# Patient Record
Sex: Female | Born: 1985 | Race: Black or African American | Hispanic: No | Smoking: Former smoker
Health system: Southern US, Community
[De-identification: ages and names within clinical notes are randomized; demographics above are authoritative.]

## PROBLEM LIST (undated history)

## (undated) ENCOUNTER — Inpatient Hospital Stay (HOSPITAL_COMMUNITY): Payer: Self-pay

## (undated) ENCOUNTER — Inpatient Hospital Stay (HOSPITAL_COMMUNITY): Payer: Medicaid Other

## (undated) DIAGNOSIS — T7491XA Unspecified adult maltreatment, confirmed, initial encounter: Secondary | ICD-10-CM

## (undated) DIAGNOSIS — D649 Anemia, unspecified: Secondary | ICD-10-CM

## (undated) DIAGNOSIS — B009 Herpesviral infection, unspecified: Secondary | ICD-10-CM

## (undated) DIAGNOSIS — N200 Calculus of kidney: Secondary | ICD-10-CM

## (undated) DIAGNOSIS — Z8619 Personal history of other infectious and parasitic diseases: Secondary | ICD-10-CM

## (undated) DIAGNOSIS — T7422XA Child sexual abuse, confirmed, initial encounter: Secondary | ICD-10-CM

## (undated) DIAGNOSIS — Z87440 Personal history of urinary (tract) infections: Secondary | ICD-10-CM

## (undated) HISTORY — DX: Herpesviral infection, unspecified: B00.9

## (undated) HISTORY — DX: Personal history of urinary (tract) infections: Z87.440

## (undated) HISTORY — DX: Child sexual abuse, confirmed, initial encounter: T74.22XA

## (undated) HISTORY — DX: Personal history of other infectious and parasitic diseases: Z86.19

## (undated) HISTORY — DX: Unspecified adult maltreatment, confirmed, initial encounter: T74.91XA

---
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
## 2005-01-05 ENCOUNTER — Emergency Department (HOSPITAL_COMMUNITY): Admission: EM | Admit: 2005-01-05 | Discharge: 2005-01-05 | Payer: Self-pay | Admitting: Emergency Medicine

## 2005-01-16 ENCOUNTER — Encounter: Admission: RE | Admit: 2005-01-16 | Discharge: 2005-01-16 | Payer: Self-pay | Admitting: Obstetrics and Gynecology

## 2005-02-13 ENCOUNTER — Emergency Department (HOSPITAL_COMMUNITY): Admission: EM | Admit: 2005-02-13 | Discharge: 2005-02-14 | Payer: Self-pay | Admitting: Emergency Medicine

## 2005-03-04 ENCOUNTER — Encounter: Admission: RE | Admit: 2005-03-04 | Discharge: 2005-03-04 | Payer: Self-pay | Admitting: Obstetrics and Gynecology

## 2005-05-05 ENCOUNTER — Emergency Department (HOSPITAL_COMMUNITY): Admission: EM | Admit: 2005-05-05 | Discharge: 2005-05-05 | Payer: Self-pay | Admitting: Emergency Medicine

## 2005-05-06 ENCOUNTER — Emergency Department (HOSPITAL_COMMUNITY): Admission: EM | Admit: 2005-05-06 | Discharge: 2005-05-06 | Payer: Self-pay | Admitting: Emergency Medicine

## 2005-05-08 ENCOUNTER — Emergency Department (HOSPITAL_COMMUNITY): Admission: EM | Admit: 2005-05-08 | Discharge: 2005-05-08 | Payer: Self-pay | Admitting: Emergency Medicine

## 2005-08-17 ENCOUNTER — Emergency Department (HOSPITAL_COMMUNITY): Admission: EM | Admit: 2005-08-17 | Discharge: 2005-08-18 | Payer: Self-pay | Admitting: Emergency Medicine

## 2005-08-29 ENCOUNTER — Ambulatory Visit (HOSPITAL_COMMUNITY): Admission: RE | Admit: 2005-08-29 | Discharge: 2005-08-29 | Payer: Self-pay | Admitting: Orthopaedic Surgery

## 2006-04-03 ENCOUNTER — Emergency Department (HOSPITAL_COMMUNITY): Admission: EM | Admit: 2006-04-03 | Discharge: 2006-04-03 | Payer: Self-pay | Admitting: Family Medicine

## 2006-05-02 ENCOUNTER — Emergency Department (HOSPITAL_COMMUNITY): Admission: EM | Admit: 2006-05-02 | Discharge: 2006-05-03 | Payer: Self-pay | Admitting: Emergency Medicine

## 2006-08-06 ENCOUNTER — Emergency Department (HOSPITAL_COMMUNITY): Admission: EM | Admit: 2006-08-06 | Discharge: 2006-08-06 | Payer: Self-pay | Admitting: Emergency Medicine

## 2007-06-21 ENCOUNTER — Emergency Department (HOSPITAL_COMMUNITY): Admission: EM | Admit: 2007-06-21 | Discharge: 2007-06-21 | Payer: Self-pay | Admitting: Emergency Medicine

## 2007-06-24 ENCOUNTER — Inpatient Hospital Stay (HOSPITAL_COMMUNITY): Admission: AD | Admit: 2007-06-24 | Discharge: 2007-06-24 | Payer: Self-pay | Admitting: Gynecology

## 2007-06-25 ENCOUNTER — Emergency Department (HOSPITAL_COMMUNITY): Admission: EM | Admit: 2007-06-25 | Discharge: 2007-06-25 | Payer: Self-pay | Admitting: Emergency Medicine

## 2007-08-06 ENCOUNTER — Emergency Department (HOSPITAL_COMMUNITY): Admission: EM | Admit: 2007-08-06 | Discharge: 2007-08-06 | Payer: Self-pay | Admitting: Emergency Medicine

## 2007-11-04 ENCOUNTER — Emergency Department (HOSPITAL_COMMUNITY): Admission: EM | Admit: 2007-11-04 | Discharge: 2007-11-04 | Payer: Self-pay | Admitting: Emergency Medicine

## 2008-01-27 ENCOUNTER — Inpatient Hospital Stay (HOSPITAL_COMMUNITY): Admission: AD | Admit: 2008-01-27 | Discharge: 2008-01-27 | Payer: Self-pay | Admitting: Obstetrics & Gynecology

## 2008-03-06 ENCOUNTER — Ambulatory Visit (HOSPITAL_COMMUNITY): Admission: RE | Admit: 2008-03-06 | Discharge: 2008-03-06 | Payer: Self-pay | Admitting: Obstetrics and Gynecology

## 2008-03-08 ENCOUNTER — Ambulatory Visit (HOSPITAL_COMMUNITY): Admission: RE | Admit: 2008-03-08 | Discharge: 2008-03-08 | Payer: Self-pay | Admitting: Obstetrics and Gynecology

## 2008-03-22 ENCOUNTER — Ambulatory Visit (HOSPITAL_COMMUNITY): Admission: RE | Admit: 2008-03-22 | Discharge: 2008-03-22 | Payer: Self-pay | Admitting: Obstetrics and Gynecology

## 2008-04-13 ENCOUNTER — Ambulatory Visit (HOSPITAL_COMMUNITY): Admission: RE | Admit: 2008-04-13 | Discharge: 2008-04-13 | Payer: Self-pay | Admitting: Obstetrics and Gynecology

## 2008-05-11 ENCOUNTER — Ambulatory Visit (HOSPITAL_COMMUNITY): Admission: RE | Admit: 2008-05-11 | Discharge: 2008-05-11 | Payer: Self-pay | Admitting: Obstetrics and Gynecology

## 2008-06-01 ENCOUNTER — Ambulatory Visit (HOSPITAL_COMMUNITY): Admission: RE | Admit: 2008-06-01 | Discharge: 2008-06-01 | Payer: Self-pay | Admitting: Obstetrics and Gynecology

## 2008-06-15 ENCOUNTER — Ambulatory Visit (HOSPITAL_COMMUNITY): Admission: RE | Admit: 2008-06-15 | Discharge: 2008-06-15 | Payer: Self-pay | Admitting: Obstetrics and Gynecology

## 2008-07-25 ENCOUNTER — Inpatient Hospital Stay (HOSPITAL_COMMUNITY): Admission: AD | Admit: 2008-07-25 | Discharge: 2008-07-25 | Payer: Self-pay | Admitting: Obstetrics and Gynecology

## 2008-07-28 ENCOUNTER — Inpatient Hospital Stay (HOSPITAL_COMMUNITY): Admission: AD | Admit: 2008-07-28 | Discharge: 2008-07-29 | Payer: Self-pay | Admitting: Obstetrics and Gynecology

## 2008-08-10 ENCOUNTER — Encounter (INDEPENDENT_AMBULATORY_CARE_PROVIDER_SITE_OTHER): Payer: Self-pay | Admitting: Obstetrics and Gynecology

## 2008-08-10 ENCOUNTER — Inpatient Hospital Stay (HOSPITAL_COMMUNITY): Admission: AD | Admit: 2008-08-10 | Discharge: 2008-08-13 | Payer: Self-pay | Admitting: Obstetrics and Gynecology

## 2008-09-08 ENCOUNTER — Emergency Department (HOSPITAL_COMMUNITY): Admission: EM | Admit: 2008-09-08 | Discharge: 2008-09-08 | Payer: Self-pay | Admitting: Emergency Medicine

## 2008-09-19 ENCOUNTER — Inpatient Hospital Stay (HOSPITAL_COMMUNITY): Admission: AD | Admit: 2008-09-19 | Discharge: 2008-09-19 | Payer: Self-pay | Admitting: Obstetrics and Gynecology

## 2009-05-26 ENCOUNTER — Emergency Department (HOSPITAL_COMMUNITY): Admission: EM | Admit: 2009-05-26 | Discharge: 2009-05-26 | Payer: Self-pay | Admitting: Emergency Medicine

## 2009-08-15 ENCOUNTER — Inpatient Hospital Stay (HOSPITAL_COMMUNITY): Admission: AD | Admit: 2009-08-15 | Discharge: 2009-08-15 | Payer: Self-pay | Admitting: Obstetrics and Gynecology

## 2009-12-17 ENCOUNTER — Inpatient Hospital Stay (HOSPITAL_COMMUNITY): Admission: AD | Admit: 2009-12-17 | Discharge: 2009-12-17 | Payer: Self-pay | Admitting: Obstetrics and Gynecology

## 2010-01-23 ENCOUNTER — Inpatient Hospital Stay (HOSPITAL_COMMUNITY): Admission: AD | Admit: 2010-01-23 | Discharge: 2010-01-23 | Payer: Self-pay | Admitting: Obstetrics and Gynecology

## 2010-03-17 ENCOUNTER — Inpatient Hospital Stay (HOSPITAL_COMMUNITY): Admission: AD | Admit: 2010-03-17 | Discharge: 2010-03-18 | Payer: Self-pay | Admitting: Obstetrics and Gynecology

## 2010-03-19 ENCOUNTER — Inpatient Hospital Stay (HOSPITAL_COMMUNITY): Admission: AD | Admit: 2010-03-19 | Discharge: 2010-03-22 | Payer: Self-pay | Admitting: Obstetrics and Gynecology

## 2010-03-19 ENCOUNTER — Encounter (INDEPENDENT_AMBULATORY_CARE_PROVIDER_SITE_OTHER): Payer: Self-pay | Admitting: Obstetrics and Gynecology

## 2010-08-13 ENCOUNTER — Emergency Department (HOSPITAL_COMMUNITY): Admission: EM | Admit: 2010-08-13 | Discharge: 2010-08-13 | Payer: Self-pay | Admitting: Emergency Medicine

## 2010-11-17 ENCOUNTER — Encounter: Payer: Self-pay | Admitting: Obstetrics and Gynecology

## 2010-11-18 ENCOUNTER — Encounter: Payer: Self-pay | Admitting: Obstetrics and Gynecology

## 2010-12-05 ENCOUNTER — Emergency Department (HOSPITAL_COMMUNITY)
Admission: EM | Admit: 2010-12-05 | Discharge: 2010-12-06 | Disposition: A | Discharge status: Home / Self Care | Payer: Medicaid Other | Attending: Emergency Medicine | Admitting: Emergency Medicine

## 2010-12-05 DIAGNOSIS — R109 Unspecified abdominal pain: Secondary | ICD-10-CM | POA: Insufficient documentation

## 2010-12-05 DIAGNOSIS — N898 Other specified noninflammatory disorders of vagina: Secondary | ICD-10-CM | POA: Insufficient documentation

## 2010-12-05 DIAGNOSIS — R112 Nausea with vomiting, unspecified: Secondary | ICD-10-CM | POA: Insufficient documentation

## 2010-12-06 LAB — URINALYSIS, ROUTINE W REFLEX MICROSCOPIC
Bilirubin Urine: NEGATIVE
Hgb urine dipstick: NEGATIVE
Specific Gravity, Urine: 1.025 (ref 1.005–1.030)
Urine Glucose, Fasting: NEGATIVE mg/dL
pH: 6 (ref 5.0–8.0)

## 2010-12-06 LAB — WET PREP, GENITAL: Clue Cells Wet Prep HPF POC: NONE SEEN

## 2010-12-06 LAB — CBC
HCT: 36 % (ref 36.0–46.0)
MCV: 84.7 fL (ref 78.0–100.0)
RDW: 13.1 % (ref 11.5–15.5)
WBC: 9 10*3/uL (ref 4.0–10.5)

## 2010-12-06 LAB — DIFFERENTIAL
Eosinophils Relative: 2 % (ref 0–5)
Lymphocytes Relative: 30 % (ref 12–46)
Lymphs Abs: 2.7 10*3/uL (ref 0.7–4.0)
Monocytes Absolute: 0.9 10*3/uL (ref 0.1–1.0)
Monocytes Relative: 10 % (ref 3–12)

## 2010-12-06 LAB — BASIC METABOLIC PANEL
CO2: 27 mEq/L (ref 19–32)
Chloride: 107 mEq/L (ref 96–112)
GFR calc non Af Amer: >60 mL/min (ref >60)
Glucose, Bld: 94 mg/dL (ref 70–99)
Potassium: 4.1 mEq/L (ref 3.5–5.1)
Sodium: 138 mEq/L (ref 135–145)

## 2010-12-06 LAB — GC/CHLAMYDIA PROBE AMP, GENITAL: Chlamydia, DNA Probe: NEGATIVE

## 2011-01-08 LAB — URINALYSIS, ROUTINE W REFLEX MICROSCOPIC
Ketones, ur: NEGATIVE mg/dL
Leukocytes, UA: NEGATIVE
Nitrite: POSITIVE — AB
Protein, ur: NEGATIVE mg/dL
pH: 6.5 (ref 5.0–8.0)

## 2011-01-08 LAB — WET PREP, GENITAL
Trich, Wet Prep: NONE SEEN
Yeast Wet Prep HPF POC: NONE SEEN

## 2011-01-08 LAB — URINE MICROSCOPIC-ADD ON

## 2011-01-08 LAB — POCT PREGNANCY, URINE: Preg Test, Ur: POSITIVE

## 2011-01-13 LAB — CBC
Platelets: 171 10*3/uL (ref 150–400)
RBC: 3.2 MIL/uL — ABNORMAL LOW (ref 3.87–5.11)
RDW: 13.9 % (ref 11.5–15.5)
WBC: 13.3 10*3/uL — ABNORMAL HIGH (ref 4.0–10.5)

## 2011-01-13 LAB — RPR: RPR Ser Ql: NONREACTIVE

## 2011-01-15 ENCOUNTER — Inpatient Hospital Stay (HOSPITAL_COMMUNITY): Payer: Medicaid Other

## 2011-01-15 ENCOUNTER — Inpatient Hospital Stay (HOSPITAL_COMMUNITY)
Admission: EM | Admit: 2011-01-15 | Discharge: 2011-01-15 | Disposition: A | Discharge status: Home / Self Care | Payer: Medicaid Other | Source: Ambulatory Visit | Attending: Obstetrics and Gynecology | Admitting: Obstetrics and Gynecology

## 2011-01-15 DIAGNOSIS — O210 Mild hyperemesis gravidarum: Secondary | ICD-10-CM | POA: Insufficient documentation

## 2011-01-15 LAB — GC/CHLAMYDIA PROBE AMP, GENITAL
Chlamydia, DNA Probe: NEGATIVE
GC Probe Amp, Genital: NEGATIVE

## 2011-01-15 LAB — URINALYSIS, ROUTINE W REFLEX MICROSCOPIC
Ketones, ur: NEGATIVE mg/dL
Nitrite: NEGATIVE
Specific Gravity, Urine: 1.025 (ref 1.005–1.030)
Urobilinogen, UA: 0.2 mg/dL (ref 0.0–1.0)
pH: 6 (ref 5.0–8.0)

## 2011-01-15 LAB — DIFFERENTIAL
Basophils Absolute: 0 10*3/uL (ref 0.0–0.1)
Basophils Relative: 0 % (ref 0–1)
Lymphocytes Relative: 19 % (ref 12–46)
Neutro Abs: 5.1 10*3/uL (ref 1.7–7.7)
Neutrophils Relative %: 62 % (ref 43–77)

## 2011-01-15 LAB — POCT PREGNANCY, URINE: Preg Test, Ur: POSITIVE

## 2011-01-15 LAB — RAPID STREP SCREEN (MED CTR MEBANE ONLY): Streptococcus, Group A Screen (Direct): NEGATIVE

## 2011-01-15 LAB — WET PREP, GENITAL
Clue Cells Wet Prep HPF POC: NONE SEEN
Trich, Wet Prep: NONE SEEN
Yeast Wet Prep HPF POC: NONE SEEN

## 2011-01-15 LAB — CBC
Hemoglobin: 11.4 g/dL — ABNORMAL LOW (ref 12.0–15.0)
MCHC: 32.7 g/dL (ref 30.0–36.0)
Platelets: 193 10*3/uL (ref 150–400)
RDW: 13 % (ref 11.5–15.5)

## 2011-01-19 LAB — STREP B DNA PROBE: Strep Group B Ag: NEGATIVE

## 2011-01-19 LAB — FETAL FIBRONECTIN: Fetal Fibronectin: NEGATIVE

## 2011-01-19 LAB — URINALYSIS, ROUTINE W REFLEX MICROSCOPIC
Bilirubin Urine: NEGATIVE
Hgb urine dipstick: NEGATIVE
Ketones, ur: NEGATIVE mg/dL
Nitrite: NEGATIVE
Specific Gravity, Urine: 1.015 (ref 1.005–1.030)
Urobilinogen, UA: 0.2 mg/dL (ref 0.0–1.0)

## 2011-01-19 LAB — WET PREP, GENITAL
Clue Cells Wet Prep HPF POC: NONE SEEN
Trich, Wet Prep: NONE SEEN

## 2011-01-19 LAB — URINE MICROSCOPIC-ADD ON

## 2011-01-19 LAB — GC/CHLAMYDIA PROBE AMP, GENITAL: Chlamydia, DNA Probe: NEGATIVE

## 2011-01-19 LAB — URINE CULTURE

## 2011-01-30 LAB — URINE MICROSCOPIC-ADD ON

## 2011-01-30 LAB — URINALYSIS, ROUTINE W REFLEX MICROSCOPIC
Bilirubin Urine: NEGATIVE
Nitrite: NEGATIVE
Specific Gravity, Urine: 1.025 (ref 1.005–1.030)
Urobilinogen, UA: 0.2 mg/dL (ref 0.0–1.0)
pH: 6 (ref 5.0–8.0)

## 2011-01-30 LAB — WET PREP, GENITAL

## 2011-01-30 LAB — GC/CHLAMYDIA PROBE AMP, GENITAL: Chlamydia, DNA Probe: NEGATIVE

## 2011-02-02 LAB — DIFFERENTIAL
Eosinophils Relative: 3 % (ref 0–5)
Lymphocytes Relative: 19 % (ref 12–46)
Lymphs Abs: 1.6 10*3/uL (ref 0.7–4.0)
Monocytes Relative: 10 % (ref 3–12)
Neutrophils Relative %: 68 % (ref 43–77)

## 2011-02-02 LAB — URINALYSIS, ROUTINE W REFLEX MICROSCOPIC
Glucose, UA: NEGATIVE mg/dL
Ketones, ur: NEGATIVE mg/dL
Leukocytes, UA: NEGATIVE
Nitrite: NEGATIVE
Specific Gravity, Urine: 1.03 (ref 1.005–1.030)
pH: 6 (ref 5.0–8.0)

## 2011-02-02 LAB — LIPASE, BLOOD: Lipase: 20 U/L (ref 11–59)

## 2011-02-02 LAB — URINE MICROSCOPIC-ADD ON

## 2011-02-02 LAB — CBC
MCHC: 31.2 g/dL (ref 30.0–36.0)
MCV: 89.6 fL (ref 78.0–100.0)
RBC: 4.51 MIL/uL (ref 3.87–5.11)
RDW: 13.1 % (ref 11.5–15.5)

## 2011-02-02 LAB — COMPREHENSIVE METABOLIC PANEL
AST: 20 U/L (ref 0–37)
CO2: 24 mEq/L (ref 19–32)
Calcium: 8.2 mg/dL — ABNORMAL LOW (ref 8.4–10.5)
Creatinine, Ser: 0.75 mg/dL (ref 0.4–1.2)
GFR calc Af Amer: >60 mL/min (ref >60)
GFR calc non Af Amer: >60 mL/min (ref >60)
Total Protein: 7 g/dL (ref 6.0–8.3)

## 2011-02-02 LAB — PREGNANCY, URINE: Preg Test, Ur: NEGATIVE

## 2011-03-11 NOTE — H&P (Signed)
[name], [name]                 ACCOUNT NO.:  [id number]   MEDICAL RECORD NO.:  [id number]          PATIENT TYPE:  MAT   LOCATION:  [facility]                          [facility]ILITY:  [facility]   PHYSICIAN:  Janine Limbo, M.D.DATE OF BIRTH:  1986-08-10   DATE OF ADMISSION:  08/10/2008  DATE OF DISCHARGE:                              HISTORY & PHYSICAL   HISTORY OF PRESENT ILLNESS:  [name] is a 25 year old female, gravida  3, para 1-1-0-3, who presents at [redacted] weeks gestation (EDC is September 06, 2008).  The patient has been followed at the [facility] of [facility].  The the patient's pregnancy  has been complicated by the fact that this infant has multiple fetal  anomalies.  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  The heart fills nearly the entire thorax.  The infant  is a female and he essentially has an absence of the abdominal wall both  anteriorly and laterally.  There is herniation of the liver, stomach,  and intestines outside of the abdomen.  There does not appear to be a  membrane that surrounds the abdominal viscera.  There is exaggerated  lordosis to the spine which diminishes the abdominal domain.  He is  missing one of his lower extremities.  There is a two-vessel umbilical  cord present.  The patient was seen in the office today and was noted to  be 3-4 cm dilated.  The infant was found to be in a breech presentation  with the abdominal contents presenting through membranes that were  protruding through the cervix.  The patient was sent to [facility]  for delivery.  The patient did have genetic studies performed and the  genetic studies were normal.   OBSTETRICAL HISTORY:  In July 2004 the patient had a cesarean section at  [redacted] weeks gestation.  She delivered two female infants.  The first infant  weighed 2 pounds 8 ounces.   The second infant weighed 2 pounds 7 ounces.  In December 2006 the patient had a vaginal delivery at [redacted] weeks  gestation where she delivered a 6 pound 8 ounce female infant.   DRUG ALLERGIES:  The patient says she is allergic to AMOXICILLIN,  PENICILLIN, SEPTRA, and CIPROFLOXACIN.  She also says that LATEX causes  itching.   PAST MEDICAL HISTORY:  The patient has a history of a cesarean section  as we previously mentioned.  The patient has had a broken toe on both of  her feet.  She denies hypertension and diabetes.   SOCIAL HISTORY:  The patient denies alcohol use, cigarette use, and  recreational drug use at the moment.  She did drink alcohol prior to her  pregnancy.   REVIEW OF SYSTEMS:  Normal pregnancy complaints.   FAMILY HISTORY:  Is noncontributory.   PHYSICAL EXAM:  VITAL SIGNS:  The patient is afebrile and her vital  signs are stable.  Her weight is  185 pounds.  HEENT: Is within normal limits.  CHEST:  Clear.  HEART:  Regular rate and rhythm.  BREASTS:  Within normal limits.  ABDOMEN:  Gravid with a fundal height of 36 cm.  EXTREMITIES:  Grossly normal  NEUROLOGY:  Grossly normal.  PELVIC:  Her cervix is 5 cm dilated, 75% effaced, and a hand seems to be  presenting through the cervix at this time.   LABORATORY VALUES:  Blood type is O+.  Beta strep is negative.  Gonorrhea negative and chlamydia negative in the third trimester.   Nonstress test is reactive and the patient is contracting mildly every 3-  6 minutes.   ASSESSMENT:  1. [redacted] weeks gestation.  2. Multiple fetal anomalies, probably not compatible with life.  3. Prior cesarean section.  4. Early labor.  5. Breech or transverse presentation.   PLAN:  A long discussion was held with the patient about the current  status of the baby and her delivery options.  We feel that the options  for management include an attempted vaginal delivery, cesarean delivery,  and the patient was actually given the option of  trying to stop  contractions and going home.  We do not think that going home is in  appropriate option.  The benefits of a vaginal delivery were reviewed  with the patient.  She understands that there may be further damage to  her infant with a vaginal delivery and she also understands that whether  or not there is further damage in delivery this infant will probably not  survive.  She can accept the possiblity that her infant may be damaged  more.  She actually wants her baby to be intubated if there is a chance  of survival.  She understands that a cesarean delivery is an operative  procedure and that the risks associated with a cesarean delivery  include, but are not limited to, anesthetic complications, bleeding,  infection, and possible damage to surrounding organs.  The patient  carefully considered all of her options and the risks and benefits  associated with those options.  After much deliberation the patient has  decided to proceed with cesarean delivery.      Janine Limbo, M.D.  Electronically Signed     AVS/MEDQ  D:  08/10/2008  T:  08/10/2008  Job:  [id number]

## 2011-03-11 NOTE — Discharge Summary (Signed)
[name], [name]                 ACCOUNT NO.:  [id number]   MEDICAL RECORD NO.:  [id number]          PATIENT TYPE:  INP   LOCATION:  9317                          [facility]ILITY:  [facility]   PHYSICIAN:  [name], M.D.DATE OF BIRTH:  December 13, 1985   DATE OF ADMISSION:  08/10/2008  DATE OF DISCHARGE:  08/13/2008                               DISCHARGE SUMMARY   ADMITTING DIAGNOSES:  1. Intrauterine pregnancy at 70 weeks' gestation.  2. Early labor.  3. Prior cesarean section  4. Fetus in breech or transverse presentation.  5. Multiple fetal anomalies with probable noncompatibility with life.   DISCHARGE DIAGNOSES:  1. Stable status post a repeat cesarean section for breech      presentation with findings of a nonviable female infant who was named      Apolinar Junes who was born at August 10, 2008, at 2338 p.m.  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  2. Grieving.  3. Desires oral contraceptive pills at present.  4. Insomnia.   HOSPITAL PROCEDURES:  1. Spinal anesthesia.  2. Repeat low transverse cesarean section.   HOSPITAL COURSE:  [name] is a 25 year old female gravida 3, para 1-1-0-  3 who presented at 39 weeks' gestation in early labor.  The patient had  been followed by MD service at [facility].  Patient's pregnancy had been  complicated by the fact that the infant had known multiple fetal  anomalies.  Ultrasounds had showed that infant may have what appeared to  be amniotic band-type syndrome and also showed small chest wall with  hypoplastic lung tissue, heart filling nearly the entire thorax.  Infant  is female.  There was absence of abdominal wall both anteriorly and  laterally, herniation of liver, stomach, and intestines outside of the  abdomen, did not appear to have membranes surrounding  abdominal viscera.  Exaggerated lordosis to the spine administering abdominal domain.  He  was missing one of lower extremities, 2-vessel umbilical cord.  Patient  was seen in the office on August 10, 2008, and was dilated 3 to 4 cm.  Infant was found to be in breech presentation with abdominal contents  presenting through membranes which were protruding through the cervix.  Patient was sent to [facility] for plans for delivery.  Patient did  have genetic studies performed including amniocentesis showing normal  karyotype.  On admission, patient's cervix was 5 cm dilated, 75%  effaced, and appeared hands to be presenting through the cervix through  membranes.  Discussion was held by Dr. Stefano Davis with the patient  regarding status of the baby and delivering options and cesarean  delivery was recommended secondary to the malpresentation.  Patient did  have Dr. Alison Davis, the attending NICU physician, talk with the patient  prior to delivery and plan was made to proceed with resuscitation versus  respite care depending on newborn's presentation at birth.  Patient was  prepped for OR and taken for a C-section which was performed under  spinal anesthesia and was performed by Dr. Stefano Davis as attending, [facility], assisting.  Findings as was mentioned were nonviable female  infant who they named Apolinar Junes who delivered August 10, 2008, at 2338  p.m.  Weight was 4 pounds 13 ounces (2187 g).  Apgar scores 1 at 1  minutes, 1 at 5 minutes, and 2 at 10 minutes and this was at 36 weeks'  gestation.  Length was approximately 14 inches.  Please see other NICU  notes regarding neonatal assessment.  Patient tolerated procedure well.  Placenta was sent to pathology.  Her estimated blood loss was 600 mL.  Patient was sent to recovery in good condition where she stayed until  she was taken to the floor by postoperative day #1.  Patient was  complaining of some itching and soreness; this was status post  neonatal  demise.  She was afebrile.  Vital signs were stable.  Abdomen benign.  Dressing was clean, dry, and intact.  Hemoglobin was down to 10.5.  Her  white count was stable at 13.4 and routine postoperative care was  continued including pastoral care and other protocol initiated for  neonatal demise.  On postoperative day #2, patient was feeling okay.  Pain was well controlled.  She was ambulating, voiding spontaneously,  tolerating p.o. liquids and solids without difficulty.  Emotional status  was within normal limits and appropriate.  She reported that day that  her mom is making funeral arrangements for the baby.  She had positive  flatulence.  Physical exam remained within normal limits.  She had small  lochia rubra.  Fundus was firm below umbilicus.  Incision clean, dry,  and intact without redness or signs or symptoms of infection.  Extremities with no edema and negative Homan's.  Routine postoperative  care and support was given.  On that day, postoperative day #2, Dr.  Pennie Davis discussed contraception with patient.  She verbally stated that  she was considering tubal but would like to further discuss at 6-week  postpartum visit.  She plans birth control pills at present until  decision was made if desires permanent sterilization.  Postop day #3,  patient was sleepy but ready for discharge.  She reports appetite very  little in the morning and picks up in the afternoon.  She denied any  nausea, vomiting, or diarrhea.  She is up without dizziness.  Pain well  managed with Motrin and p.r.n. Percocet.  Still continues to desire oral  contraceptive pills at present for contraception.  She did have some  difficulty sleeping and reported that she did not fall asleep until  approximately 5 a.m.  Per report, vaginal bleeding is light.  Voiding  spontaneous and positive flatulence.   PHYSICAL EXAM:  VITAL SIGNS:  Stable.  She is afebrile.  Patient was  sleepy on arrival, was having  trouble nodding while talking with her but  she was alert and oriented.  LUNGS:  Clear.  ABDOMEN:  Soft, appropriately tender.  Fundus was firm below umbilicus.  Incision is open to air, clean, dry, and intact.  No drainage or signs  or symptoms of infection.  She had small rubra lochia.  EXTREMITIES:  Within normal limits and she had negative Homan's.   Patient was deemed to have received full benefit of her hospital stay  and is being discharged home in  stable condition.  Discharge followup is  to occur at in 6 weeks at [facility] or p.r.n.   DISCHARGE MEDICATIONS:  1. Motrin 600 mg p.o. every 6 hours p.r.n. pain.  2. Percocet 5/325 one to 2 tabs p.o. every 4 to 6 hours p.r.n.      moderate to severe pain.  3. Ortho-Cept 1 tab p.o. daily, dispensed 1 pack with 5 refills.      Patient is to delay initiation until 2 weeks from today on a Sunday      start.  She was instructed on taking daily as prescribed.  4. Ambien 10 mg 1 tab p.o. q.h.s. p.r.n. insomnia.  5. Colace 1 tab p.o. q.a.m., may repeat dose in p.m. p.r.n.      constipation while on Percocet.  6. She is to continue a multivitamin 1 tab p.o. daily.   Support was given and instructed patient to call as needed with any  questions or concerns.      Candice Savage, [place]      [name], M.D.  Electronically Signed    CHS/MEDQ  D:  08/13/2008  T:  08/13/2008  Job:  [id number]

## 2011-03-11 NOTE — Op Note (Signed)
[name], [name]                 ACCOUNT NO.:  [id number]   MEDICAL RECORD NO.:  [id number]          PATIENT TYPE:  INP   LOCATION:  9317                          [facility]   PHYSICIAN:  Janine Limbo, M.D.DATE OF BIRTH:  Jun 23, 1986   DATE OF PROCEDURE:  DATE OF DISCHARGE:                               OPERATIVE REPORT   PREOPERATIVE DIAGNOSES:  1. Thirty six weeks gestation.  2. Anomalous infant.  3. Early labor.  4. Breech presentation.  5. Prior cesarean section.   POSTOPERATIVE DIAGNOSES:  1. Thirty six weeks gestation.  2. Anomalous infant.  3. Early labor.  4. Breech presentation.  5. Prior cesarean section.   PROCEDURE:  Repeat low transverse cesarean section.   SURGEON:  Janine Limbo, MD   FIRST ASSISTANT:  [name], CNM   ANESTHETIC:  Spinal.   DISPOSITION:  [name] is a 25 year old female, gravida 3, para 1-1-0-3,  who presents at [redacted] weeks gestation.  The patient has been followed at  the [facility].  This pregnancy has been complicated by the fact  that the infant has multiple anomalies.  These anomalies are thought to  be consistent with perhaps an amniotic band syndrome.  The infant had an  ultrasound which shows hyperplastic lungs, and a very small chest.  The  heart completely fills the thoracic cavity.  He has an absent abdominal  wall, both anteriorly and laterally.  There is herniation of the liver,  stomach, and intestines outside of the abdomen.  There is a lordosis to  the spine.  One of the lower extremities is missing.  There is a two-  vessel umbilical cord.  The patient was seen in the office today and was  noted to be 3-4 cm dilated.  Upon evaluation at [facility], the  cervix was 5 cm dilated.  An ultrasound in the office showed a breech  presentation and evaluation at [facility] show that the presenting  part was a hand.   The patient had a cesarean section in 2004, where she  delivered twins at 57 weeks' gestation.  She subsequently had a vaginal  delivery after cesarean section in 2006.  We discussed our management  options.  We discussed the benefits and risk associated with a vaginal  delivery and we also discussed the benefits and risks associated with a  cesarean delivery.  The patient could not accept the fact that more  damage could be done to her son if a vaginal delivery was attempted.  She also requested that her son be intubated after delivery.  After  carefully considering all of her options, she elected to proceed with a  repeat cesarean delivery.  The risk associated with cesarean delivery  were reviewed including, but not limited to, anesthetic, complications,  bleeding, infections, and possible damage to the surrounding organs.   FINDINGS:  The weight of the delivery infant is currently not known.  The Apgars scores were 1 at 1 minute, 2 at 5 minutes, and 2 at  10  minutes.  Multiple anomalies were appreciated as mentioned above.  The  abdominal contents were indeed outside of the abdominal cavity.  The  chest was hypoplastic.  The infant did have a heart beat and for that  reason the neonatologist in attendance elected to intubate the infant  and attempt oxygenation.  It became apparent that the infant was not  able to oxygenate its blood.  The little boy was named Therapist, sports.  The  placenta was sent to pathology.  The uterus, fallopian tubes, and the  ovaries were normal for the gravid state.   PROCEDURE:  The patient was taken to the operating room where a spinal  anesthetic was given.  The patient's abdomen, perineum, and outer vagina  were prepped with multiple layers of Betadine.  A Foley catheter was  placed in the bladder.  The patient was sterilely draped.  The patient  had received clindamycin 900 mg intravenously prior to coming to the  operating room.  Compression stockings were  placed on the patient's  legs.  A low transverse incision was made in the abdomen after 10 mL of  0.5% Marcaine with epinephrine were instilled.  The incision was  extended through the subcutaneous tissue, the fascia, and the anterior  peritoneum.  An incision was made in the lower uterine segment and  extended in a low transverse fashion.  The membranes were ruptured.  The  anomalous bowel was encountered.  The one leg of the infant was  delivered and then the bow was delivered through the incision.  We then  delivered the chest, shoulders, and head.  A short cord was present.  The cord was clamped and cut and the infant was handed to the awaiting  pediatric team.  Routine cord blood studies were obtained.  The placenta  was removed.  The uterine cavity was cleaned of amniotic fluid, clotted  blood, and membranes.  The uterine incision was closed using a running  locking suture of 2-0 Vicryl.  An imbricating suture of 2-0 Vicryl was  then placed.  Hemostasis was adequate.  The pelvis was vigorously  irrigated.  The anterior peritoneum and abdominal musculature were  reapproximated in the midline using 2-0 Vicryl.  The fascia was closed  using a running suture of 0 Vicryl followed by 3 interrupted sutures of  0 Vicryl.  The skin was reapproximated using a subcuticular suture of 3-  0 Monocryl.  Sponge, needle, and instrument counts were correct on 2  occasions.  The estimated blood loss for the procedure was 600 mL.  The  patient tolerated her procedure well.  The patient did have nausea  throughout her procedure and she was taken to the recovery room in  stable condition.  The placenta was sent to pathology.  The infant was  taken to the neonatal intensive care nursery.      Janine Limbo, M.D.  Electronically Signed     AVS/MEDQ  D:  08/11/2008  T:  08/11/2008  Job:  [id number]

## 2011-03-14 NOTE — H&P (Signed)
[name], [name]                 ACCOUNT NO.:  [id number]   MEDICAL RECORD NO.:  [id number]          PATIENT TYPE:  MAT   LOCATION:  [facility]C                          [facility]:  [facility]   PHYSICIAN:  Janine Limbo, M.D.DATE OF BIRTH:  1986/09/15   DATE OF ADMISSION:  11/09/2004  DATE OF DISCHARGE:                                HISTORY & PHYSICAL   HISTORY OF PRESENT ILLNESS:  [name] is an 25 year old single black female,  gravida 2, para 1-0-0-2, at 37-2/7 weeks, who presents complaining of  leaking a gush of fluid around 8:30 p.m. on November 08, 2004.  She denies  any subsequent leaking.  She denies any painful uterine contractions.  She  reports positive fetal movement.  She denies any nausea, vomiting, headaches  or visual disturbances.  Her pregnancy has been followed at [facility] by the M.D. service and has been at risk for:  #1 - A history of 36-  week preterm delivery of twins by cesarean section for questionable  abruption, #2 - new paternity with this pregnancy, #3 - adolescent, #4 -  questionable LMP.   OBSTETRICAL/GYNECOLOGICAL HISTORY:  She is a gravida 2, para 0-1-0-2, who  had a primary low transverse cesarean section for delivery of twins in July  of '04 with possible abruption being the cause and I think one of her babies  was breech as well.  This current pregnancy is of a different paternity and  EDC by ultrasound is November 28, 2004.  Her other GYN history is  noncontributory, other than having Chlamydia in May of 2004.   MEDICAL HISTORY:  She reports having had the usual childhood diseases.  She  has no other medical issues and her only surgery was the cesarean section  for her twins.   ALLERGIES:  She reports having allergy to AMOXICILLIN and SEPTRA; she is not  sure of the reaction that she has had with either of those.   FAMILY HISTORY:  Her family history is significant for maternal grandmother  with chronic hypertension and varicosities  and adult-onset diabetes, and  also maternal grandmother has lupus and paternal grandmother has possible  leukemia.   GENETIC HISTORY:  Her genetic history is negative.   SOCIAL HISTORY:  The father of the baby is Chanetta Marshall; he is involve and  supportive.  He is a full-time Archivist; she is also a Consulting civil engineer.  She  denies any illicit drug use, alcohol or smoking with this pregnancy.   PRENATAL LABORATORIES:  Her blood type is O-positive, her antibody screen is  negative.  Sickle cell trait is negative.  Syphilis is nonreactive.  Rubella  is immune.  Hepatitis B surface antigen is negative.  HIV is nonreactive.  Cystic fibrosis is negative.  Chlamydia was positive in July of '05 and Pap  was within normal limits.  Gonorrhea was negative; multiple testings  throughout her pregnancy have been negative for GC, Chlamydia and beta  strep.  Her 1-hour Glucola was 91.   PHYSICAL EXAMINATION:  VITAL SIGNS:  Her vital signs are stable.  She is  afebrile.  HEENT:  Grossly within normal limits.  HEART:  Regular rate and rhythm.  CHEST:  Her chest is clear.  BREASTS:  Breasts are soft and nontender.  ABDOMEN:  Her abdomen is gravid with irregular mild uterine contractions.  Her fetal heart rate is overall reactive and reassuring, but has had  episodes of decreased long-term variability and 1 episode on the tracing  that showed a fetal heart rate deceleration down to the 60s for  approximately 2-3 minutes and the ultrasound tech reported noting a  deceleration down to the 90s while doing her ultrasound.  Current heart rate  is in the 140s to 150s.  PELVIC:  Sterile speculum exam was performed and that shows no pooling,  negative Nitrazine, negative fern.  Cervix is fingertip, thick, vertex and  high.  EXTREMITIES:  Her extremities are within normal limits.   ACCESSORY CLINICAL DATA:  Her ultrasound had a biophysical profile score of  6 out of 8 with 0 for breathing movements, cephalic  presentation with an AFI  of 14.9 and a cervix of 3-cm long.   ASSESSMENT:  1.  Intrauterine pregnancy at 37-2/7 weeks.  2.  Previous cesarean section, planning vaginal birth attempt.  3.  Questionable fetal heart rate decelerations.  4.  Unfavorable cervix.   PLAN:  Her plan per consult with Dr. Janine Limbo is to admit for  further monitoring.     Shel   SJD/MEDQ  D:  11/09/2004  T:  11/09/2004  Job:  [id number]

## 2011-03-14 NOTE — H&P (Signed)
NAME:  [name], [name]                           ACCOUNT NO.:  [id number]   MEDICAL RECORD NO.:  [id number]                   PATIENT TYPE:  INP   LOCATION:  9165                                 [facility]   PHYSICIAN:  Naima A. Dillard, M.D.              DATE OF BIRTH:  03-31-1986   DATE OF ADMISSION:  05/01/2003  DATE OF DISCHARGE:                                HISTORY & PHYSICAL   HISTORY OF PRESENT ILLNESS:  [name] is a 25 year old, single, African-  American female, G1, P0 at 27-3/7 weeks by ultrasound which is her best date  in criteria, who presents complaining of uterine contractions and/or  cramping since about 2 a.m. this morning.  She denies any leaking or vaginal  bleeding, but has developed some bloody show since being in maternity  admissions.  She denies any nausea, vomiting, headaches or visual  disturbances.  Her pregnancy has been followed at [facility] by  the M.D. service and has been complicated by twin gestation, being an  adolescent and positive Chlamydia treated on Mar 20, 2003, with  azithromycin.   ALLERGIES:  AMOXICILLIN.  SEPTRA.   PAST OBSTETRICAL HISTORY:  Primigravida with LMP of October 10, 2002, that  she reports is certain and was normal.  However, early ultrasound dating  gave her an [facility] of July 28, 2003, which has been decided to be her best  due date.   PAST GYNECOLOGICAL HISTORY:  No other GYN issues.   PAST MEDICAL HISTORY:  1. She reports having had the usual childhood diseases.  2. Occasional urinary tract infection.   FAMILY HISTORY:  Significant for maternal grandfather with hypertension.  Maternal grandfather with adult-onset diabetes.  Maternal grandmother with  possibly lupus.   GENETIC HISTORY:  Negative.   SOCIAL HISTORY:  She is single.  The father of the baby is [name] and  he is not involved.  She is a Consulting civil engineer at [employer].  She lives with  her mom and has good support from her family.   She is of the [place]  faith.  She denies any illicit drug use, alcohol or smoking with this  pregnancy.   PRENATAL LABORATORY DATA:  Not currently available.   PHYSICAL EXAMINATION:  VITAL SIGNS:  Stable.  Afebrile.  HEENT:  Grossly within normal limits.  HEART:  Regular rate and rhythm.  CHEST:  Clear.  BREASTS:  Soft and nontender.  ABDOMEN:  Gravid with uterine contractions initially noted every three to  four minutes that have decreased with subcu terbutaline.  PELVIC:  A  speculum exam was performed and noted a moderate amount of bloody show,  visible bulging membranes.  On digital exam, she is completely dilated with  bulging membranes and presenting part is difficult to assess without  penetrating the membranes.  EXTREMITIES:  Within normal limits.   ASSESSMENT:  Intrauterine pregnancy at 27-3/7  weeks by ultrasound criteria  with twin gestation in preterm labor.   PLAN:  1. Per consult with Dr. Normand Sloop, to give her betamethasone, clindamycin IV,     magnesium sulfate IV for tocolysis.  2. Stat ultrasound for position and growth.     [name], C.N.M.              Naima A. Normand Sloop, M.D.    SJD/MEDQ  D:  05/01/2003  T:  05/01/2003  Job:  [id number]

## 2011-03-14 NOTE — H&P (Signed)
[name], Davis                 ACCOUNT NO.:  [id number]   MEDICAL RECORD NO.:  [id number]          PATIENT TYPE:  INP   LOCATION:  9164                          [facility]   PHYSICIAN:  Osborn Coho, M.D.   DATE OF BIRTH:  Feb 15, 1986   DATE OF ADMISSION:  11/18/2004  DATE OF DISCHARGE:                                HISTORY & PHYSICAL   REASON FOR ADMISSION:  Mrs. [name] is an 25 year old gravida 2, para 2.  She  had twins by cesarean section in July 2004.  She presents now at 38-4/7  weeks, EDD November 28, 2004 by 7-week, 6-day ultrasound.  Her contractions  are every three to four minutes.  She reports positive fetal movement.  No  bleeding, no rupture of membranes.  She is noted to be 4 cm dilated and is  to be admitted in labor.  She denies any pregnancy induced hypertension  symptoms.  No headache, visual changes or epigastric ain.  Her pregnancy has  been followed by the M.D. service at [facility] and is remarkable for:  1.  Adolescent.  2.  Preterm delivery at 27 weeks of twins by cesarean section, ?      abruption/breech presentation.  3.  Desires VBAC.  4.  Group B strep negative.   This patient began prenatal care at office of [facility] on May 21, 2004 at [redacted]  weeks gestation.  EDC determined by 7-week 6-day ultrasound.  At her first  prenatal visit, she was noted to have positive Chlamydia.  She received  treatment with Zithromax 1 g p.o. and her test of cure was negative.  GC and  Chlamydia at 36 weeks were both negative.  Otherwise the patient's prenatal  course has been essentially unremarkable.  She did receive Delalutin weekly.  She began getting injections at 22 weeks. She stopped at 28 weeks and then  resumed until 36 weeks. She has been normotensive throughout her pregnancy  with no proteinuria.   LABORATORY DATA:  Prenatal lab work on May 01, 2004 showed hemoglobin and  hematocrit 12.8 and 37.9, platelets 276,000.  Blood type and RH O positive,  antibody screen  negative.  Sickle cell trait negative.  VDRL nonreactive.  Rubella immune.  Hepatitis B surface antigen negative.  HIV nonreactive.  Pap smear within normal limits.  GC negative.  Chlamydia positive on May 21, 2004.  Test of cure negative.  GC and Chlamydia all negative on August 14, 2004 and then again at 36 weeks, GC and Chlamydia negative.  CF testing  negative.  Declined quad screen.  At 36 weeks culture of the vaginal tract  is negative for Group B strep.   OB HISTORY:  In July 2004, the patient had a primary low transverse cesarean  at 27-3/7 weeks with the birth of two female twins; one 2 pounds 8 ounces,  and one 2 pounds 7 ounces.  They are both doing well at this time and this  is the patient's secondary pregnancy with a different paternity.   PAST MEDICAL HISTORY:  Unremarkable.   PAST SURGICAL  HISTORY:  Cesarean section for twins.   FAMILY HISTORY:  Maternal grandfather with a history of chronic  hypertension.  The patient's mother has history of lupus.  Maternal  grandfather diabetes.  Paternal grandmother ? leukemia.   GENETIC HISTORY:  Unremarkable.  There is no family history of familial or  chromosomal disorders, children that died in infancy or that were born with  birth defects.   ALLERGIES:  AMOXICILLIN and SEPTRA.   SOCIAL HISTORY:  She denies the use of tobacco or alcohol or illicit drugs.  [name] is a single, [place] American female.  She is a [employer] boy.  The father of her baby's name is Chanetta Marshall.  He is here with her and  is supportive.  She does not subscribe to a religious faith.   REVIEW OF SYSTEMS:  As described above.  The patient is at term in early  labor and desires VBAC.   PHYSICAL EXAMINATION:  VITAL SIGNS: Stable.  Afebrile.  HEENT: Unremarkable.  HEART:  Regular rate and rhythm.  LUNGS:  Clear.  ABDOMEN:  Gravid in its contour.  Uterine fundus is noted to extend 38 cm  above  the level of the pubic symphysis.  Leopold's  maneuvers __________ ,  cephalic presentation and the estimated fetal weight is 6-1/2 to 7 pounds.  The baseline of the fetal heart rate monitor is 140's with average long-term  variability.  Reactivity is present with no periodic changes.  The patient  is contracting every three to four minutes.  PELVIC:  Digital exam shows the cervix to be 4 cm dilated, 90% effaced with  the cephalic presenting part at minus-2 station with membranes intact.  EXTREMITIES:  No pathologic edema.  DTRs 1+ with no clonus.   ASSESSMENT:  Intrauterine pregnancy at term in early active labor.  Desires  vaginal birth after cesarean section.   PLAN:  Admit per Dr. Osborn Coho.  Routine M.D. orders.  The patient may  have Stadol 1 mg plus Phenergan 12.5 mg IV.      SDM/MEDQ  D:  11/18/2004  T:  11/18/2004  Job:  [id number]

## 2011-03-14 NOTE — Op Note (Signed)
NAME:  [name], [name]                           ACCOUNT NO.:  [id number]   MEDICAL RECORD NO.:  [id number]                   PATIENT TYPE:  INP   LOCATION:  9130                                 [facility]   PHYSICIAN:  Naima A. Dillard, M.D.              DATE OF BIRTH:  Feb 04, 1986   DATE OF PROCEDURE:  05/01/2003  DATE OF DISCHARGE:                                 OPERATIVE REPORT   PREOPERATIVE DIAGNOSES:  1. Premature labor at 27 weeks with twins.  2. Footling breech presentation.   POSTOPERATIVE DIAGNOSES:  1. Premature labor at 27 weeks with twins.  2. Footling breech presentation.  3. Question of abruption with twin A.   PROCEDURE:  Primary low transverse cesarean section.   SURGEON:  Naima A. Normand Sloop, M.D.   ASSISTANT:  Concha Pyo. Duplantis, C.N.M.   ANESTHESIA:  Spinal.   ESTIMATED BLOOD LOSS:  700 mL.   URINE OUTPUT:  400 mL clear urine.   INTRAVENOUS FLUIDS:  2500 mL crystalloid.   COMPLICATIONS:  None.   FINDINGS:  A large amount of dark blood noted to escape through the uterine  incision and membranes, consistent with abruption.  Normal-appearing uterus,  tubes, and ovaries bilaterally.  Baby A was born with vertex, clear fluid,  born at 34 with a cord pH of 7.24 and Apgars of 5 and 7, weight is not  available.  Baby B was footling breech but after A was delivered changed to  transverse, and then I was able to convert the baby to a vertex.  The  membranes were ruptured, clear fluid was noted, and the baby was delivered  vertex at 1815 with a cord pH of 7.39 and Apgars of 5 and 7, weight is  unavailable.   PROCEDURE IN DETAIL:  The patient was taken to the operating room, where she  was given spinal anesthesia.  Patient in dorsal supine position with a left  lateral tilt.  Foley was already in place.  The patient was prepped and  draped in a normal sterile fashion.  A Pfannenstiel skin incision was then  made with a scalpel and carried down to the  fascia.  The fascia was incised  in the midline, extended bilaterally using pickups with teeth and Mayo  scissors.  The superior aspect of the fascia was dissected off the rectus  muscle both bluntly and sharply using Mayo scissors.  The inferior aspect of  the fascia was dissected off the rectus muscles in a similar fashion.  The  rectus muscles were separated in the midline.  The peritoneum was  identified, tented up, and entered sharply with Metzenbaum scissors.  The  peritoneal incision extended superiorly and inferiorly with good  visualization of bowel and bladder.  The bladder blade was inserted and the  vesicouterine peritoneum was identified, tented up and entered sharply,  extended bilaterally.  The bladder blade was reinserted.  Primary lower  transverse uterine incision was then made with a scalpel and extended  bilaterally bluntly.  After the incision was made between the membranes and  the uterus, there was a large amount of old blood seen.  Baby A's fluid sac  was ruptured, clear fluid.  The baby was delivered vertex and the findings  as noted above.  The cord was clamped and cut and handed off to the awaiting  pediatrician.  Baby B was then delivered as stated above and handed off to  awaiting pediatrician.  Cord gases and cord blood were obtained from cord.  The placenta was manually delivered.  The uterus was cleared of all clot and  debris and the placenta sent to pathology.  The uterus was repaired with 0  Vicryl in a running locked fashion, a second layer of imbrication was used.  Hemostasis was noted.  Irrigation was then used on the abdomen.  Hemostasis  was noted.  The peritoneum was reapproximated using 0 chromic.  The fascia  was closed with 0 Vicryl in a running fashion.  The subcutaneous tissue was  noted to be hemostatic.  The skin was closed with staples.  Sponge, lap, and  needle counts were correct x2.  The patient went to the recovery room in  stable  condition.                                               Naima A. Normand Sloop, M.D.    NAD/MEDQ  D:  05/01/2003  T:  05/02/2003  Job:  [id number]

## 2011-03-14 NOTE — Discharge Summary (Signed)
NAME:  [name], [name]                           ACCOUNT NO.:  [id number]   MEDICAL RECORD NO.:  [id number]                   PATIENT TYPE:  INP   LOCATION:  9130                                 [facility]   PHYSICIAN:  Naima A. Dillard, M.D.              DATE OF BIRTH:  April 24, 1986   DATE OF ADMISSION:  05/01/2003  DATE OF DISCHARGE:  05/06/2003                                 DISCHARGE SUMMARY   ADMISSION DIAGNOSES:  1. Intrauterine pregnancy at 27-3/7 weeks.  2. Twin gestation.  3. Preterm labor.   DISCHARGE DIAGNOSES:  1. Intrauterine pregnancy at 27-3/7 weeks.  2. Twin gestation.  3. Preterm labor.  4. Malpresentation of second twin resulting in primary low transverse     cesarean section.  5. Questionable abruption.  6. Postoperative anemia without hemodynamic instability and also     postoperative fever.   HOSPITAL PROCEDURES:  1. Magnesium sulfate administration.  2. Ultrasound.  3. Spinal anesthesia.  4. Primary low transverse cesarean section for twin female infants weighing     2 pounds 8 ounces, and 2 pounds 7 ounces.   HOSPITAL COURSE:  The patient was admitted with preterm labor and on  admission she was noted to have bulging membranes and was completely  dilated.  She was given betamethasone, clindamycin, and magnesium sulfate.  Ultrasound was done for position and growth and twin A was noted to be in  the vertex position, but twin B was noted to be in the footling breech  position.  The patient was wishing to deliver twin A vaginally but twin B  appeared to be presenting with twin A and therefore C-section was  recommended for delivery.  This was performed by Dr. Normand Sloop under spinal  anesthesia with no complications other than possible abruption which was  noted upon uterine incision.  Twin A was born with a cord pH of 7.34, Apgars  of 5 and 7, and weighed 2 pounds 8 ounces.  Twin B was born footling breech  and then changed to transverse.  Had a cord  pH of 7.39, weighed 2 pounds 7  ounces, and Apgars of 5 and 7.  The patient was taken to recovery.   On post-op day number one the patient was out of the bed, Foley was taken  out, and the patient was pumping for breast milk.  She was afebrile.  Incision was clean and dry.  She was noted to have a hemoglobin of 7.9, but  orthostatic blood pressures were within normal limits and the patient  declined transfusion.  On postop day number two and three, the patient  continued to do well.  Social work consults were conducted for assistance  with coping.  Postop day number three, the patient presented with fever of  102.9.  Lungs were clear.  Abdomen was soft and appropriately tender.  Leukocytosis was noted.  Blood cultures and  urine cultures were sent.  Cefotan was began IV.  On postop day four, the patient was improving.  Maximum temperature was 101.3.  White blood count had decreased from 14.4 to  10.4.  Gentamicin and clindamycin were begun.  Blood cultures showed a  preliminary report of gram negative rods and postop care was continued.  Gentamicin peak and trough levels were within normal limits.  On postop day  number five, the patient was feeling better except for complaining of some  dysuria.  Vital signs were stable.  The patient has been afebrile for  greater than 24 hours.  Abdomen was soft and appropriately tender.  Incision  was clean, dry, and intact.  Lochia small.  She is deemed to have received  full benefit from her hospital stay and was discharged home.   DISCHARGE MEDICATIONS:  1. Motrin 600 mg p.o. q.6 h. p.r.n.  2. Tylox one to two p.o. q.4 h. p.r.n.   DISCHARGE LABS:  White blood cell count 10.4.  Hemoglobin 8.1.  Platelets  174.   DISCHARGE INSTRUCTIONS:  Per [facility] handout.  Discharge followup in six weeks  or p.r.n.     Marie L. Williams, C.N.M.                 Naima A. Normand Sloop, M.D.    MLW/MEDQ  D:  05/06/2003  T:  05/06/2003  Job:  [id number]

## 2011-03-20 ENCOUNTER — Ambulatory Visit (HOSPITAL_COMMUNITY)
Admission: RE | Admit: 2011-03-20 | Discharge: 2011-03-20 | Disposition: A | Discharge status: Home / Self Care | Payer: Medicaid Other | Source: Ambulatory Visit | Attending: Obstetrics and Gynecology | Admitting: Obstetrics and Gynecology

## 2011-03-20 ENCOUNTER — Other Ambulatory Visit: Payer: Self-pay | Admitting: Obstetrics and Gynecology

## 2011-03-20 DIAGNOSIS — N938 Other specified abnormal uterine and vaginal bleeding: Secondary | ICD-10-CM | POA: Insufficient documentation

## 2011-03-20 DIAGNOSIS — N9489 Other specified conditions associated with female genital organs and menstrual cycle: Secondary | ICD-10-CM | POA: Insufficient documentation

## 2011-03-20 DIAGNOSIS — N949 Unspecified condition associated with female genital organs and menstrual cycle: Secondary | ICD-10-CM | POA: Insufficient documentation

## 2011-03-20 LAB — CBC
HCT: 40.2 % (ref 36.0–46.0)
Platelets: 273 10*3/uL (ref 150–400)
RBC: 4.67 MIL/uL (ref 3.87–5.11)
RDW: 13 % (ref 11.5–15.5)
WBC: 7.8 10*3/uL (ref 4.0–10.5)

## 2011-03-20 NOTE — H&P (Signed)
NAMEMARCELENE, Davis                 ACCOUNT NO.:  [id number]  MEDICAL RECORD NO.:  [id number]           PATIENT TYPE:  O  LOCATION:  SDC                           [facility]  PHYSICIAN:  Osborn Coho, M.D.   DATE OF BIRTH:  [birth year]  DATE OF ADMISSION:  03/19/2011 DATE OF DISCHARGE:                             HISTORY & PHYSICAL   [name] is a 25 year old single black female para 2-2-1-3, who is status post elective abortion (February 07, 2011), who presents for hysteroscopy, D and C because of a thickened endometrial lining, pelvic pain, and irregular bleeding.  The patient was seen at [facility] with complaints of pelvic tenderness and irregular bleeding following a pregnancy termination done elsewhere on February 07, 2011.  The patient states that 2 weeks following her procedure, she had bleeding that required her to change a pad 4 times daily for 2 weeks but once she started her birth control pills, she has only bled 3 times a week and required 1 pad daily.  The patient had a urine pregnancy test done that was negative and a pelvic ultrasound that showed a uterus measuring 9.92 cm x 7.18 cm x 4.47 cm, an endometrium measuring 1.36 cm with a mass showing increased blood flow, measuring 1.83 x 2.42 x 2.45 cm per 3D rendering.  The patient's ovaries appeared normal on that study with no free fluid in the cul-de-sac.  The patient goes on to say she has not had any vaginitis symptoms, urinary tract symptoms, any nausea, vomiting, diarrhea, or fever.  The patient has consented to proceed with hysteroscopy, D and C for management of her endometrial mass and irregular bleeding.  OB HISTORY:  Gravida 5, para 2-2-1-3.  The patient had 2 cesarean sections and 1 vaginal birth after cesarean section in 2011, 2009, 2006, and 2004 (the patient had a neonatal death due to multiple anomalies).  GYN HISTORY:  Menarche 25 years old.  The patient has had irregular bleeding since  her pregnancy termination.  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  She denies any history of abnormal Pap smears. She has used an intrauterine device in the past, but currently uses  Loestrin 24 birth control pills for contraception.  The patient's last Pap smear was May 2012.  MEDICAL HISTORY:  Positive for anemia, molluscum contagiosum, and toe fractures.  SURGICAL HISTORY:  Elective pregnancy termination in April 2012.  FAMILY HISTORY:  Asthma, lupus.  SOCIAL HISTORY:  The patient is single and she works as a Agricultural engineer.  HABITS:  She does not use alcohol, tobacco, or illicit drugs.  CURRENT MEDICATIONS:  Loestrin 24 Fe.  ALLERGIES:  PENICILLIN, SULFA, and LATEX, all cause itching.  REVIEW OF SYSTEMS:  The patient denies any chest pain, shortness of breath, headache, vision changes, nausea, vomiting, diarrhea, myalgias, arthralgias, and except as is mentioned in history present illness, the patient's review of systems is otherwise negative.  PHYSICAL EXAMINATION:  VITAL SIGNS:  Blood pressure 100/62, weight 201 pounds, height 5 feet 3-1/2 inches tall. BACK:  No CVA tenderness. ABDOMEN:  Without tenderness,  guarding, rebound, masses, or organomegaly. PELVIC:  EG/BUS was normal.  Vagina was normal.  Cervix was nontender without lesions.  Uterus appeared normal size, shape, and consistency, however, was tender.  The patient had right adnexal tenderness with questionable palpable mass, but no left adnexal tenderness or masses palpated.  IMPRESSION: 1. Irregular vaginal bleeding. 2. Endometrial mass. 3. Pelvic pain. 4. Status post pregnancy termination on February 07, 2011.  DISPOSITION:  A discussion was held with the patient regarding indications for procedure along with its risks which include but are not limited to reaction to anesthesia, damage to adjacent organs, infection, excessive bleeding, and the potential of endometrial scarring.  The patient  verbalized understanding of these risks and has consented to proceed with a hysteroscopy D and C at [facility] on Mar 20, 2011.     Elmira J. Lowell Guitar, P.A.-C   ______________________________ Osborn Coho, M.D.    EJP/MEDQ  D:  03/19/2011  T:  03/20/2011  Job:  [id number]  Electronically Signed by Henreitta Leber P.A. on 03/20/2011 09:09:24 AM Electronically Signed by Osborn Coho M.D. on 03/20/2011 10:49:12 AM

## 2011-04-15 NOTE — Op Note (Signed)
  [name], [name]                 ACCOUNT NO.:  [id number]  MEDICAL RECORD NO.:  [id number]  LOCATION:  [facility]                          [facility]  PHYSICIAN:  Osborn Coho, M.D.   DATE OF BIRTH:  1986-07-03  DATE OF PROCEDURE: DATE OF DISCHARGE:  03/20/2011                              OPERATIVE REPORT   PREOPERATIVE DIAGNOSES: 1. Irregular bleeding. 2. Endometrial mass.  POSTOPERATIVE DIAGNOSIS: 1. Irregular bleeding. 2. Endometrial mass.  PROCEDURES: 1. Hysteroscopy. 2. Dilation and curettage.  ATTENDING:  Osborn Coho, MD  ANESTHESIA:  General via LMA.  FINDINGS:  Necrotic-appearing tissue on the posterior wall of the uterus.  SPECIMENS TO PATHOLOGY:  Endometrial curettings.  FLUIDS:  800 mL  URINE OUTPUT:  Quantity sufficient via straight cath prior to procedure.  ESTIMATED BLOOD LOSS:  Minimal.  COMPLICATIONS:  None.  PROCEDURE IN DETAILS:  The patient was taken to the operating room after the risks, benefits and alternatives discussed with the patient.  The patient verbalized understanding and consent signed and witnessed.  The patient was placed under general anesthesia and prepped and draped in a normal sterile fashion.  A bivalve speculum was placed in the patient's vagina and the anterior lip of the cervix grasped with single-tooth tenaculum.  The cervix was dilated for passage of the hysteroscope and the hysteroscope was introduced and findings as noted above.  Curettage was performed until a gritty texture was noted and curettings sent to Pathology.  After tenaculum was placed on the anterior lip of the cervix, a paracervical block was administered using a total of 10 mL of 1% lidocaine.  The hysteroscope was reintroduced and no obvious intracavitary lesions remained.  All instruments were removed.  There was good hemostasis at tenaculum sites.  Count was correct.  The patient tolerated the tolerated procedure well and was returned to  recovery room in good condition.     Osborn Coho, M.D.     AR/MEDQ  D:  04/14/2011  T:  04/14/2011  Job:  [id number]  Electronically Signed by Osborn Coho M.D. on 04/15/2011 06:31:25 PM

## 2011-07-17 LAB — WET PREP, GENITAL

## 2011-07-17 LAB — URINALYSIS, ROUTINE W REFLEX MICROSCOPIC
Ketones, ur: NEGATIVE
Nitrite: NEGATIVE
Protein, ur: NEGATIVE
pH: 7

## 2011-07-17 LAB — GC/CHLAMYDIA PROBE AMP, GENITAL: Chlamydia, DNA Probe: NEGATIVE

## 2011-07-17 LAB — URINE MICROSCOPIC-ADD ON

## 2011-07-17 LAB — RPR: RPR Ser Ql: NONREACTIVE

## 2011-07-17 LAB — PREGNANCY, URINE: Preg Test, Ur: NEGATIVE

## 2011-07-22 LAB — CBC
HCT: 33.4 — ABNORMAL LOW
MCHC: 33.8
MCV: 87.9
Platelets: 216
RDW: 12.6
WBC: 9.2

## 2011-07-22 LAB — URINALYSIS, ROUTINE W REFLEX MICROSCOPIC
Bilirubin Urine: NEGATIVE
Glucose, UA: NEGATIVE
Hgb urine dipstick: NEGATIVE
Ketones, ur: 15 — AB
Protein, ur: NEGATIVE

## 2011-07-22 LAB — POCT PREGNANCY, URINE: Preg Test, Ur: POSITIVE

## 2011-07-22 LAB — WET PREP, GENITAL: Yeast Wet Prep HPF POC: NONE SEEN

## 2011-07-28 DIAGNOSIS — B009 Herpesviral infection, unspecified: Secondary | ICD-10-CM

## 2011-07-28 HISTORY — DX: Herpesviral infection, unspecified: B00.9

## 2011-07-28 LAB — CBC
HCT: 31.1 — ABNORMAL LOW
Hemoglobin: 10.2 — ABNORMAL LOW
Hemoglobin: 10.5 — ABNORMAL LOW
Platelets: 196
RBC: 3.61 — ABNORMAL LOW
RDW: 12.9
WBC: 13.4 — ABNORMAL HIGH

## 2011-07-28 LAB — URINE CULTURE: Colony Count: >=100000

## 2011-07-28 LAB — RPR: RPR Ser Ql: NONREACTIVE

## 2011-07-28 LAB — DIFFERENTIAL
Lymphocytes Relative: 16
Lymphs Abs: 1.6
Monocytes Absolute: 0.8
Monocytes Relative: 9
Neutro Abs: 7.3

## 2011-07-28 LAB — URINE MICROSCOPIC-ADD ON

## 2011-07-28 LAB — URINALYSIS, ROUTINE W REFLEX MICROSCOPIC
Ketones, ur: 15 — AB
Nitrite: NEGATIVE
pH: 7

## 2011-07-28 LAB — CULTURE, BETA STREP (GROUP B ONLY)

## 2011-07-28 LAB — TYPE AND SCREEN: Antibody Screen: NEGATIVE

## 2011-07-28 LAB — GC/CHLAMYDIA PROBE AMP, GENITAL
Chlamydia, DNA Probe: NEGATIVE
GC Probe Amp, Genital: NEGATIVE

## 2011-07-28 LAB — RUBELLA SCREEN: Rubella: 86.3 — ABNORMAL HIGH

## 2011-07-28 LAB — WET PREP, GENITAL: Clue Cells Wet Prep HPF POC: NONE SEEN

## 2011-07-29 LAB — CBC
HCT: 35.5 — ABNORMAL LOW
MCV: 85.9
Platelets: 222
RBC: 4.13
WBC: 11.8 — ABNORMAL HIGH

## 2011-07-29 LAB — GC/CHLAMYDIA PROBE AMP, GENITAL: Chlamydia, DNA Probe: NEGATIVE

## 2011-08-07 LAB — URINALYSIS, ROUTINE W REFLEX MICROSCOPIC
Glucose, UA: NEGATIVE
Nitrite: NEGATIVE
Specific Gravity, Urine: 1.019
pH: 6

## 2011-08-07 LAB — POCT PREGNANCY, URINE: Preg Test, Ur: NEGATIVE

## 2011-08-07 LAB — URINE MICROSCOPIC-ADD ON

## 2011-08-07 LAB — GC/CHLAMYDIA PROBE AMP, GENITAL
Chlamydia, DNA Probe: NEGATIVE
GC Probe Amp, Genital: NEGATIVE

## 2011-08-08 LAB — CBC
HCT: 40.7
Hemoglobin: 13.6
MCHC: 34.1
MCV: 87.1
MCV: 87.2
Platelets: 249
RDW: 12.8

## 2011-08-08 LAB — DIFFERENTIAL
Basophils Absolute: 0
Eosinophils Absolute: 0.1
Eosinophils Relative: 1
Lymphocytes Relative: 20
Monocytes Absolute: 0.6

## 2011-08-08 LAB — I-STAT 8, (EC8 V) (CONVERTED LAB)
BUN: 6
Bicarbonate: 26.6 — ABNORMAL HIGH
Chloride: 103
HCT: 46
pCO2, Ven: 47.4
pH, Ven: 7.357 — ABNORMAL HIGH

## 2011-08-08 LAB — URINALYSIS, ROUTINE W REFLEX MICROSCOPIC
Bilirubin Urine: NEGATIVE
Nitrite: NEGATIVE
Specific Gravity, Urine: 1.024
Urobilinogen, UA: 0.2
pH: 7.5

## 2011-08-08 LAB — WET PREP, GENITAL: Yeast Wet Prep HPF POC: NONE SEEN

## 2011-08-08 LAB — POCT I-STAT CREATININE: Creatinine, Ser: 1

## 2011-08-08 LAB — HCG, SERUM, QUALITATIVE: Preg, Serum: NEGATIVE

## 2011-08-08 LAB — URINE MICROSCOPIC-ADD ON

## 2011-08-08 LAB — PREGNANCY, URINE: Preg Test, Ur: NEGATIVE

## 2011-12-08 ENCOUNTER — Emergency Department (HOSPITAL_COMMUNITY)
Admission: EM | Admit: 2011-12-08 | Discharge: 2011-12-08 | Disposition: A | Discharge status: Home / Self Care | Payer: Medicaid Other | Attending: Emergency Medicine | Admitting: Emergency Medicine

## 2011-12-08 ENCOUNTER — Encounter (HOSPITAL_COMMUNITY): Payer: Self-pay | Admitting: *Deleted

## 2011-12-08 DIAGNOSIS — R059 Cough, unspecified: Secondary | ICD-10-CM | POA: Insufficient documentation

## 2011-12-08 DIAGNOSIS — R05 Cough: Secondary | ICD-10-CM | POA: Insufficient documentation

## 2011-12-08 DIAGNOSIS — R070 Pain in throat: Secondary | ICD-10-CM | POA: Insufficient documentation

## 2011-12-08 DIAGNOSIS — J069 Acute upper respiratory infection, unspecified: Secondary | ICD-10-CM | POA: Insufficient documentation

## 2011-12-08 DIAGNOSIS — J3489 Other specified disorders of nose and nasal sinuses: Secondary | ICD-10-CM | POA: Insufficient documentation

## 2011-12-08 MED ORDER — PRENATAL COMPLETE 14-0.4 MG PO TABS: 1.0000 | ORAL_TABLET | Freq: Every day | ORAL | Status: DC

## 2011-12-08 MED ORDER — ALBUTEROL SULFATE HFA 108 (90 BASE) MCG/ACT IN AERS
2.0000 | INHALATION_SPRAY | RESPIRATORY_TRACT | Status: DC | PRN
  Administered 2011-12-08: 2 via RESPIRATORY_TRACT
  Filled 2011-12-08: qty 6.7

## 2011-12-08 MED ORDER — LORATADINE 10 MG PO TABS: 10.0000 mg | ORAL_TABLET | Freq: Every day | ORAL | Status: DC

## 2011-12-08 NOTE — ED Notes (Signed)
The pt says she has taken a preg test that was pos

## 2011-12-08 NOTE — ED Notes (Signed)
She has had a cough since Thursday night.  Productive cough clear sputum.  No temp

## 2011-12-08 NOTE — ED Provider Notes (Signed)
History     CSN: [id number]  Arrival date & time 12/08/11  1945   First MD Initiated Contact with Patient 12/08/11 2048      Chief Complaint  Patient presents with  . Cough    (Consider location/radiation/quality/duration/timing/severity/associated sxs/prior treatment) Patient is a 26 y.o. female presenting with URI. The history is provided by the patient. No language interpreter was used.  URI The primary symptoms include sore throat and cough. Primary symptoms do not include fever, fatigue, headaches, abdominal pain, nausea or vomiting. The current episode started 3 to 5 days ago. This is a new problem. The problem has been gradually worsening.  The sore throat began 2 days ago. The sore throat has been gradually worsening since its onset. The sore throat is mild in intensity.  The cough began 3 to 5 days ago. The cough is new. The cough is productive. The sputum is clear.  Symptoms associated with the illness include congestion and rhinorrhea. The illness is not associated with chills, plugged ear sensation or sinus pressure.    History reviewed. No pertinent past medical history.  History reviewed. No pertinent past surgical history.  History reviewed. No pertinent family history.  History  Substance Use Topics  . Smoking status: Never Smoker   . Smokeless tobacco: Not on file  . Alcohol Use: No    OB History    Grav Para Term Preterm Abortions TAB SAB Ect Mult Living                  Review of Systems  Constitutional: Negative for fever, chills, activity change, appetite change and fatigue.  HENT: Positive for congestion, sore throat and rhinorrhea. Negative for neck pain, neck stiffness and sinus pressure.   Respiratory: Positive for cough. Negative for shortness of breath.   Cardiovascular: Negative for chest pain and palpitations.  Gastrointestinal: Negative for nausea, vomiting and abdominal pain.  Genitourinary: Negative for dysuria, urgency, frequency and  flank pain.  Neurological: Negative for dizziness, weakness, light-headedness, numbness and headaches.  All other systems reviewed and are negative.    Allergies  Amoxicillin; Penicillins; and Septra  Home Medications   Current Outpatient Rx  Name Route Sig Dispense Refill  . LORATADINE 10 MG PO TABS Oral Take 1 tablet (10 mg total) by mouth daily. 30 tablet 0  . PRENATAL COMPLETE 14-0.4 MG PO TABS Oral Take 1 tablet by mouth daily. 60 each 0    BP 112/59  Pulse 92  Temp(Src) 98.1 F (36.7 C) (Oral)  Resp 18  SpO2 99%  LMP 11/06/2011  Physical Exam  Nursing note and vitals reviewed. Constitutional: She is oriented to person, place, and time. She appears well-developed and well-nourished. No distress.  HENT:  Head: Normocephalic and atraumatic.  Mouth/Throat: Oropharynx is clear and moist. No oropharyngeal exudate.  Eyes: Conjunctivae and EOM are normal. Pupils are equal, round, and reactive to light.  Neck: Normal range of motion. Neck supple.  Cardiovascular: Normal rate, regular rhythm, normal heart sounds and intact distal pulses.  Exam reveals no gallop and no friction rub.   No murmur heard. Pulmonary/Chest: Effort normal and breath sounds normal. No respiratory distress.  Abdominal: Soft. Bowel sounds are normal. There is no tenderness.  Musculoskeletal: Normal range of motion. She exhibits no tenderness.  Lymphadenopathy:    She has no cervical adenopathy.  Neurological: She is alert and oriented to person, place, and time. No cranial nerve deficit.  Skin: Skin is warm and dry. No rash noted.  ED Course  Procedures (including critical care time)  Labs Reviewed - No data to display No results found.   1. URI (upper respiratory infection)       MDM  Consistent with an upper respiratory infection. There is no indication for blood work or chest x-ray at this time. The patient is breathing comfortably. Patient is pregnant. She'll be prescribed Claritin,  albuterol inhaler, prenatal vitamins. Instructed to followup with her primary care physician.        Dayton Bailiff, MD 12/08/11 2102

## 2011-12-09 ENCOUNTER — Inpatient Hospital Stay (HOSPITAL_COMMUNITY)
Admission: AD | Admit: 2011-12-09 | Discharge: 2011-12-09 | Disposition: A | Discharge status: Home / Self Care | Payer: Medicaid Other | Source: Ambulatory Visit | Attending: Obstetrics and Gynecology | Admitting: Obstetrics and Gynecology

## 2011-12-09 ENCOUNTER — Encounter (HOSPITAL_COMMUNITY): Payer: Self-pay | Admitting: *Deleted

## 2011-12-09 ENCOUNTER — Inpatient Hospital Stay (HOSPITAL_COMMUNITY): Payer: Medicaid Other

## 2011-12-09 DIAGNOSIS — J069 Acute upper respiratory infection, unspecified: Secondary | ICD-10-CM | POA: Diagnosis present

## 2011-12-09 DIAGNOSIS — Z3201 Encounter for pregnancy test, result positive: Secondary | ICD-10-CM

## 2011-12-09 DIAGNOSIS — O99891 Other specified diseases and conditions complicating pregnancy: Secondary | ICD-10-CM | POA: Insufficient documentation

## 2011-12-09 DIAGNOSIS — R109 Unspecified abdominal pain: Secondary | ICD-10-CM | POA: Insufficient documentation

## 2011-12-09 LAB — POCT PREGNANCY, URINE: Preg Test, Ur: POSITIVE — AB

## 2011-12-09 LAB — CBC
MCH: 28.1 pg (ref 26.0–34.0)
MCHC: 32.9 g/dL (ref 30.0–36.0)
Platelets: 272 10*3/uL (ref 150–400)
RDW: 13.4 % (ref 11.5–15.5)

## 2011-12-09 LAB — URINALYSIS, ROUTINE W REFLEX MICROSCOPIC
Bilirubin Urine: NEGATIVE
Ketones, ur: NEGATIVE mg/dL
Nitrite: NEGATIVE
Protein, ur: NEGATIVE mg/dL
Urobilinogen, UA: 0.2 mg/dL (ref 0.0–1.0)

## 2011-12-09 LAB — HCG, QUANTITATIVE, PREGNANCY: hCG, Beta Chain, Quant, S: 3034 m[IU]/mL — ABNORMAL HIGH (ref <5)

## 2011-12-09 LAB — DIFFERENTIAL
Basophils Relative: 0 % (ref 0–1)
Eosinophils Absolute: 0.3 10*3/uL (ref 0.0–0.7)
Eosinophils Relative: 3 % (ref 0–5)
Neutrophils Relative %: 57 % (ref 43–77)

## 2011-12-09 MED ORDER — GUAIFENESIN-CODEINE 100-10 MG/5ML PO SOLN
5.0000 mL | ORAL | Status: DC | PRN
  Administered 2011-12-09: 5 mL via ORAL
  Filled 2011-12-09: qty 5

## 2011-12-09 MED ORDER — GUAIFENESIN-CODEINE 100-10 MG/5ML PO SOLN: 5.0000 mL | ORAL | Status: AC | PRN

## 2011-12-09 MED ORDER — AZITHROMYCIN 250 MG PO TABS
500.0000 mg | ORAL_TABLET | Freq: Once | ORAL | Status: AC
  Administered 2011-12-09: 500 mg via ORAL
  Filled 2011-12-09: qty 2

## 2011-12-09 MED ORDER — AZITHROMYCIN 250 MG PO TABS: ORAL_TABLET | ORAL | Status: AC

## 2011-12-09 NOTE — Discharge Instructions (Signed)
Pregnancy  If you are planning on getting pregnant, it is a good idea to make a preconception appointment with your care- giver to discuss having a healthy lifestyle before getting pregnant. Such as, diet, weight, exercise, taking prenatal vitamins especially folic acid (it helps prevent brain and spinal cord defects), avoiding alcohol, smoking and illegal drugs, medical problems (diabetes, convulsions), family history of genetic problems, working conditions and immunizations. It is better to have knowledge of these things and do something about them before getting pregnant.  In your pregnancy, it is important to follow certain guidelines to have a healthy baby. It is very important to get good prenatal care and follow your caregiver's instructions. Prenatal care includes all the medical care you receive before your baby's birth. This helps to prevent problems during the pregnancy and childbirth.  HOME CARE INSTRUCTIONS    Start your prenatal visits by the 12th week of pregnancy or before when possible. They are usually scheduled monthly at first. They are more often in the last 2 months before delivery. It is important that you keep your caregiver's appointments and follow your caregiver's instructions regarding medication use, exercise, and diet.   During pregnancy, you are providing food for you and your baby. Eat a regular, well-balanced diet. Choose foods such as meat, fish, milk and other dairy products, vegetables, fruits, whole-grain breads and cereals. Your caregiver will inform you of the ideal weight gain depending on your current height and weight. Drink lots of liquids. Try to drink 8 glasses of water a day.   Alcohol is associated with a number of birth defects including fetal alcohol syndrome. It is best to avoid alcohol completely. Smoking will cause low birth rate and prematurity. Use of alcohol and nicotine during your pregnancy also increases the chances that your child will be chemically  dependent later in their life and may contribute to SIDS (Sudden Infant Death Syndrome).   Do not use illegal drugs.   Only take prescription or over-the-counter medications that are recommended by your caregiver. Other medications can cause genetic and physical problems in the baby.   Morning sickness can often be helped by keeping soda crackers at the bedside. Eat a couple before arising in the morning.   A sexual relationship may be continued until near the end of pregnancy if there are no other problems such as early (premature) leaking of amniotic fluid from the membranes, vaginal bleeding, painful intercourse or belly (abdominal) pain.   Exercise regularly. Check with your caregiver if you are unsure of the safety of some of your exercises.   Do not use hot tubs, steam rooms or saunas. These increase the risk of fainting or passing out and hurting yourself and the baby. Swimming is OK for exercise. Get plenty of rest, including afternoon naps when possible especially in the third trimester.   Avoid toxic odors and chemicals.   Do not wear high heels. They may cause you to lose your balance and fall.   Do not lift over 5 pounds. If you do lift anything, lift with your legs and thighs, not your back.   Avoid long trips, especially in the third trimester.   If you have to travel out of the city or state, take a copy of your medical records with you.  SEEK IMMEDIATE MEDICAL CARE IF:    You develop an unexplained oral temperature above 102 F (38.9 C), or as your caregiver suggests.   You have leaking of fluid from the vagina. If   your temperature and inform your caregiver of this when you call.   There is vaginal spotting or bleeding. Notify your caregiver of the amount and how many pads are used.   You continue to feel sick to your stomach (nauseous) and have no relief from remedies suggested, or you throw up (vomit) blood or coffee ground like  materials.   You develop upper abdominal pain.   You have round ligament discomfort in the lower abdominal area. This still must be evaluated by your caregiver.   You feel contractions of the uterus.   You do not feel the baby move, or there is less movement than before.   You have painful urination.   You have abnormal vaginal discharge.   You have persistent diarrhea.   You get a severe headache.   You have problems with your vision.   You develop muscle weakness.   You feel dizzy and faint.   You develop shortness of breath.   You develop chest pain.   You have back pain that travels down to your leg and feet.   You feel irregular or a very fast heartbeat.   You develop excessive weight gain in a short period of time (5 pounds in 3 to 5 days).   You are involved with a domestic violence situation.  Document Released: 10/13/2005 Document Revised: 06/25/2011 Document Reviewed: 04/06/2009 [facility] Patient Information 2012 [place], [place].   Upper Respiratory Infection, Adult An upper respiratory infection (URI) is also known as the common cold. It is often caused by a type of germ (virus). Colds are easily spread (contagious). You can pass it to others by kissing, coughing, sneezing, or drinking out of the same glass. Usually, you get better in 1 or 2 weeks.  HOME CARE   Only take medicine as told by your doctor.   Use a warm mist humidifier or breathe in steam from a hot shower.   Drink enough water and fluids to keep your pee (urine) clear or pale yellow.   Get plenty of rest.   Return to work when your temperature is back to normal or as told by your doctor. You may use a face mask and wash your hands to stop your cold from spreading.  GET HELP RIGHT AWAY IF:   After the first few days, you feel you are getting worse.   You have questions about your medicine.   You have chills, shortness of breath, or brown or red spit (mucus).   You have yellow or brown  snot (nasal discharge) or pain in the face, especially when you bend forward.   You have a fever, puffy (swollen) neck, pain when you swallow, or white spots in the back of your throat.   You have a bad headache, ear pain, sinus pain, or chest pain.   You have a high-pitched whistling sound when you breathe in and out (wheezing).   You have a lasting cough or cough up blood.   You have sore muscles or a stiff neck.  MAKE SURE YOU:   Understand these instructions.   Will watch your condition.   Will get help right away if you are not doing well or get worse.  Document Released: 03/31/2008 Document Revised: 06/25/2011 Document Reviewed: 02/17/2011 [facility] Patient Information 2012 [place], [place].

## 2011-12-09 NOTE — Progress Notes (Signed)
Pt LMP 11/06/2011 +UPT at home, having cramping since last night, cough, sore throat and fever since 12/05/2011.

## 2011-12-09 NOTE — ED Provider Notes (Signed)
History   26 yo [id number] presented c/o persistent productive cough, nasal congestion, pelvic cramping, with + home UPT.  LMP 11/06/11 (4 5/7 weeks).  Denies bleeding.  Onset of symptoms 2 days ago--seen at [facility] yesterday for cold symptoms.  Rx'd with Claritin and inhaler, with no benefit.  Reports some vomiting because of  coughing, denies nausea.  Reports children have had mild colds.  Chief Complaint  Patient presents with  . Abdominal Cramping  . Cough  . Fever  . Sore Throat     OB History    Grav Para Term Preterm Abortions TAB SAB Ect Mult Living   4 3 2 1 1  0 1 0 1 4      Past Medical History  Diagnosis Date  . No pertinent past medical history     Past Surgical History  Procedure Date  . Cesarean section     History reviewed. No pertinent family history.  History  Substance Use Topics  . Smoking status: Never Smoker   . Smokeless tobacco: Not on file  . Alcohol Use: No    Allergies:  Allergies  Allergen Reactions  . Amoxicillin Anaphylaxis  . Penicillins Anaphylaxis  . Septra (Bactrim) Anaphylaxis    Prescriptions prior to admission  Medication Sig Dispense Refill  . loratadine (CLARITIN) 10 MG tablet Take 1 tablet (10 mg total) by mouth daily.  30 tablet  0  . Prenatal Vit-Fe Fumarate-FA (PRENATAL COMPLETE) 14-0.4 MG TABS Take 1 tablet by mouth daily.  60 each  0     Physical Exam   Blood pressure 138/63, pulse 99, temperature 97.5 F (36.4 C), temperature source Oral, resp. rate 16, height 5\' 4"  (1.626 m), weight 89.994 kg (198 lb 6.4 oz), last menstrual period 11/06/2011, SpO2 100.00%.  Deep cough at frequent intervals, yellow phlegm at times. Ears clear Throat clear Nasal turbinates erythematous and boggy Chest clear Heart RRR without murmur Abd soft, NT Pelvic--no significant discharge in vault.  Cervix closed, NT.   Uterus NT, small Ext WNL    ED Course  Positive home UPT URI Cramping  Plan: CBC, diff UA, UPT QHCG Rx Z-pak,  with 1st dose tonight Robitussin AC now and Rx [place] as appropriate, based on QHCG result.  Nigel Bridgeman, CNM, [place] 12/09/11 8:45pm   Addendum: Results for orders placed during the hospital encounter of 12/09/11 (from the past 24 hour(s))  URINALYSIS, ROUTINE W REFLEX MICROSCOPIC     Status: Normal   Collection Time   12/09/11  7:55 PM      Component Value Range   Color, Urine YELLOW  YELLOW    APPearance CLEAR  CLEAR    Specific Gravity, Urine 1.020  1.005 - 1.030    pH 7.0  5.0 - 8.0    Glucose, UA NEGATIVE  NEGATIVE (mg/dL)   Hgb urine dipstick NEGATIVE  NEGATIVE    Bilirubin Urine NEGATIVE  NEGATIVE    Ketones, ur NEGATIVE  NEGATIVE (mg/dL)   Protein, ur NEGATIVE  NEGATIVE (mg/dL)   Urobilinogen, UA 0.2  0.0 - 1.0 (mg/dL)   Nitrite NEGATIVE  NEGATIVE    Leukocytes, UA NEGATIVE  NEGATIVE   POCT PREGNANCY, URINE     Status: Abnormal   Collection Time   12/09/11  8:02 PM      Component Value Range   Preg Test, Ur POSITIVE (*) NEGATIVE   CBC     Status: Normal   Collection Time   12/09/11  8:05 PM  Component Value Range   WBC 10.2  4.0 - 10.5 (K/uL)   RBC 4.38  3.87 - 5.11 (MIL/uL)   Hemoglobin 12.3  12.0 - 15.0 (g/dL)   HCT 40.9  81.1 - 91.4 (%)   MCV 85.4  78.0 - 100.0 (fL)   MCH 28.1  26.0 - 34.0 (pg)   MCHC 32.9  30.0 - 36.0 (g/dL)   RDW 78.2  95.6 - 21.3 (%)   Platelets 272  150 - 400 (K/uL)  DIFFERENTIAL     Status: Abnormal   Collection Time   12/09/11  8:05 PM      Component Value Range   Neutrophils Relative 57  43 - 77 (%)   Neutro Abs 5.9  1.7 - 7.7 (K/uL)   Lymphocytes Relative 26  12 - 46 (%)   Lymphs Abs 2.6  0.7 - 4.0 (K/uL)   Monocytes Relative 14 (*) 3 - 12 (%)   Monocytes Absolute 1.4 (*) 0.1 - 1.0 (K/uL)   Eosinophils Relative 3  0 - 5 (%)   Eosinophils Absolute 0.3  0.0 - 0.7 (K/uL)   Basophils Relative 0  0 - 1 (%)   Basophils Absolute 0.0  0.0 - 0.1 (K/uL)  HCG, QUANTITATIVE, PREGNANCY     Status: Abnormal   Collection Time   12/09/11  8:05  PM      Component Value Range   hCG, Beta Chain, Quant, S 3034 (*) <5 (mIU/mL)  WET PREP, GENITAL     Status: Abnormal   Collection Time   12/09/11  8:22 PM      Component Value Range   Yeast Wet Prep HPF POC NONE SEEN  NONE SEEN    Trich, Wet Prep NONE SEEN  NONE SEEN    Clue Cells Wet Prep HPF POC NONE SEEN  NONE SEEN    WBC, Wet Prep HPF POC FEW (*) NONE SEEN     Will do pelvic US.  Nigel Bridgeman, CNM, MN 12/09/11 9:10pm  Addendum: [place] 4w 6d IUGS, no FP, no definite yolk sac.  CL cyst on right Feeling some better after initial Zithromax dose. Will d/c home with Rx to complete Zpak regimen, Robitussin AC. Will have office call patient to schedule follow-up US late next week.  Nigel Bridgeman, CNM, [place] 12/09/11 11p

## 2011-12-09 NOTE — Progress Notes (Signed)
SSE per CNM.  Wet prep and cultures collected.  VE done.  

## 2011-12-09 NOTE — Progress Notes (Signed)
V. Latham, CNM at bedside.  Assessment done and poc discussed with pt.  

## 2011-12-10 LAB — GC/CHLAMYDIA PROBE AMP, GENITAL: GC Probe Amp, Genital: NEGATIVE

## 2012-01-15 ENCOUNTER — Encounter (INDEPENDENT_AMBULATORY_CARE_PROVIDER_SITE_OTHER): Payer: Medicaid Other | Admitting: Obstetrics and Gynecology

## 2012-01-15 DIAGNOSIS — Z3480 Encounter for supervision of other normal pregnancy, unspecified trimester: Secondary | ICD-10-CM

## 2012-01-15 DIAGNOSIS — N898 Other specified noninflammatory disorders of vagina: Secondary | ICD-10-CM

## 2012-01-29 ENCOUNTER — Other Ambulatory Visit (INDEPENDENT_AMBULATORY_CARE_PROVIDER_SITE_OTHER): Payer: Medicaid Other

## 2012-01-29 DIAGNOSIS — Z36 Encounter for antenatal screening of mother: Secondary | ICD-10-CM

## 2012-02-12 ENCOUNTER — Ambulatory Visit (INDEPENDENT_AMBULATORY_CARE_PROVIDER_SITE_OTHER): Payer: Medicaid Other | Admitting: Obstetrics and Gynecology

## 2012-02-12 ENCOUNTER — Telehealth: Payer: Self-pay

## 2012-02-12 ENCOUNTER — Encounter: Payer: Self-pay | Admitting: Obstetrics and Gynecology

## 2012-02-12 VITALS — BP 100/66 | Wt 205.0 lb

## 2012-02-12 DIAGNOSIS — Z98891 History of uterine scar from previous surgery: Secondary | ICD-10-CM

## 2012-02-12 DIAGNOSIS — B0089 Other herpesviral infection: Secondary | ICD-10-CM | POA: Insufficient documentation

## 2012-02-12 DIAGNOSIS — T782XXA Anaphylactic shock, unspecified, initial encounter: Secondary | ICD-10-CM

## 2012-02-12 DIAGNOSIS — Z3480 Encounter for supervision of other normal pregnancy, unspecified trimester: Secondary | ICD-10-CM

## 2012-02-12 DIAGNOSIS — O09299 Supervision of pregnancy with other poor reproductive or obstetric history, unspecified trimester: Secondary | ICD-10-CM

## 2012-02-12 DIAGNOSIS — IMO0002 Reserved for concepts with insufficient information to code with codable children: Secondary | ICD-10-CM

## 2012-02-12 NOTE — Telephone Encounter (Signed)
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 Pt voices understanding. [name]

## 2012-02-12 NOTE — Progress Notes (Signed)
C/o frontal HA Unknown type of herpes delivery Trial of benadryl and claritin Requests C/S with me. Declines BTL 1st trim screen neg Pap due in 03/2012 Will let me know about 17P at NV RTO 2wks for AFP and f/u headaches

## 2012-02-12 NOTE — Progress Notes (Signed)
Addended by: Osborn Coho on: 02/12/2012 12:24 PM   Modules accepted: Level of Service

## 2012-02-26 ENCOUNTER — Other Ambulatory Visit: Payer: Medicaid Other

## 2012-02-26 ENCOUNTER — Telehealth: Payer: Self-pay

## 2012-02-26 ENCOUNTER — Ambulatory Visit (INDEPENDENT_AMBULATORY_CARE_PROVIDER_SITE_OTHER): Payer: Medicaid Other | Admitting: Obstetrics and Gynecology

## 2012-02-26 DIAGNOSIS — Z3490 Encounter for supervision of normal pregnancy, unspecified, unspecified trimester: Secondary | ICD-10-CM

## 2012-02-26 DIAGNOSIS — B0089 Other herpesviral infection: Secondary | ICD-10-CM

## 2012-02-26 DIAGNOSIS — Z9889 Other specified postprocedural states: Secondary | ICD-10-CM

## 2012-02-26 DIAGNOSIS — Z98891 History of uterine scar from previous surgery: Secondary | ICD-10-CM

## 2012-02-26 DIAGNOSIS — Z331 Pregnant state, incidental: Secondary | ICD-10-CM

## 2012-02-26 NOTE — Telephone Encounter (Signed)
Called and LM for pt to call back. Per AR she was supposed to start her 17P shots today, but forgot to address it with her at her visit. She should come back at her earliest convenience for lab only visit for 17P. [name]

## 2012-02-26 NOTE — Progress Notes (Signed)
Still with HA and it is worsening.  Did not try benadryl and only tried claritin one day. AFP today Refer to neurologist secondary to worsening HAs 17P qwk - pt called and coming tomorrow to get 1st shot RTO for ROB and anatomy scan in 2wks

## 2012-02-27 ENCOUNTER — Other Ambulatory Visit: Payer: Medicaid Other

## 2012-03-01 LAB — AFP, QUAD SCREEN
AFP: 26.3 IU/mL
Down Syndrome Scr Risk Est: 1:991 {titer}
HCG, Total: 24231 m[IU]/mL
Interpretation-AFP: NEGATIVE
MoM for INH: 1.49
Open Spina bifida: NEGATIVE
Osb Risk: 1:35000 {titer}
uE3 Mom: 0.71
uE3 Value: 0.4 ng/mL

## 2012-03-04 ENCOUNTER — Telehealth: Payer: Self-pay

## 2012-03-04 NOTE — Telephone Encounter (Signed)
Spoke to Mom who gave me Curry's husband's #. I LM w/ husband for Harshitha to call me about coming in for 17P injection. [name]

## 2012-03-04 NOTE — Telephone Encounter (Signed)
LM for pt to call back asap re: the fact that she did not come in for 17P the day after we spoke. I told her to call asap to get this scheduled. [name]

## 2012-03-11 ENCOUNTER — Encounter: Payer: Medicaid Other | Admitting: Obstetrics and Gynecology

## 2012-03-11 ENCOUNTER — Ambulatory Visit (INDEPENDENT_AMBULATORY_CARE_PROVIDER_SITE_OTHER): Payer: Medicaid Other

## 2012-03-11 ENCOUNTER — Encounter: Payer: Self-pay | Admitting: Obstetrics and Gynecology

## 2012-03-11 ENCOUNTER — Other Ambulatory Visit: Payer: Medicaid Other

## 2012-03-11 ENCOUNTER — Ambulatory Visit (INDEPENDENT_AMBULATORY_CARE_PROVIDER_SITE_OTHER): Payer: Medicaid Other | Admitting: Obstetrics and Gynecology

## 2012-03-11 VITALS — BP 102/60 | Wt 207.0 lb

## 2012-03-11 DIAGNOSIS — Z331 Pregnant state, incidental: Secondary | ICD-10-CM

## 2012-03-11 DIAGNOSIS — Z3480 Encounter for supervision of other normal pregnancy, unspecified trimester: Secondary | ICD-10-CM

## 2012-03-11 LAB — US OB COMP + 14 WK

## 2012-03-11 NOTE — Progress Notes (Signed)
Pt complains of yeast infection which has been noticed for one week. It has been itching /JM

## 2012-03-11 NOTE — Progress Notes (Signed)
Pt left before visit. Called at home to review report. Sono to repeat in 4 weeks

## 2012-03-11 NOTE — Progress Notes (Signed)
Sono:  EFW: 229g  CX:  3.88cm    Vertex presentation, posterior grade 0, normal fluid.  No anomalies seen.  DNWTK gender at this time. Normal ovaries, normal fluid, normal adnexas. Unseen anatomy due to fetal position, suggest  Follow up in 4 - 6 weeks to complete.  Pt left before visit. Pt called to review report. Questions answered. AFP normal. Quad screen normal

## 2012-03-24 ENCOUNTER — Encounter: Payer: Medicaid Other | Admitting: Obstetrics and Gynecology

## 2012-03-30 ENCOUNTER — Encounter: Payer: Medicaid Other | Admitting: Obstetrics and Gynecology

## 2012-03-31 ENCOUNTER — Encounter: Payer: Self-pay | Admitting: Obstetrics and Gynecology

## 2012-04-07 ENCOUNTER — Ambulatory Visit (INDEPENDENT_AMBULATORY_CARE_PROVIDER_SITE_OTHER): Payer: Medicaid Other

## 2012-04-07 ENCOUNTER — Encounter: Payer: Self-pay | Admitting: Obstetrics and Gynecology

## 2012-04-07 ENCOUNTER — Ambulatory Visit (INDEPENDENT_AMBULATORY_CARE_PROVIDER_SITE_OTHER): Payer: Medicaid Other | Admitting: Obstetrics and Gynecology

## 2012-04-07 VITALS — BP 110/70 | Temp 98.1°F | Wt 212.0 lb

## 2012-04-07 DIAGNOSIS — Z3490 Encounter for supervision of normal pregnancy, unspecified, unspecified trimester: Secondary | ICD-10-CM

## 2012-04-07 DIAGNOSIS — O358XX0 Maternal care for other (suspected) fetal abnormality and damage, not applicable or unspecified: Secondary | ICD-10-CM

## 2012-04-07 DIAGNOSIS — Z331 Pregnant state, incidental: Secondary | ICD-10-CM

## 2012-04-07 DIAGNOSIS — J069 Acute upper respiratory infection, unspecified: Secondary | ICD-10-CM

## 2012-04-07 MED ORDER — AZITHROMYCIN 250 MG PO TABS: ORAL_TABLET | ORAL | Status: AC

## 2012-04-07 NOTE — Progress Notes (Signed)
C/o major low back pain x 1 week Pt voided before u/s will attempt clean catch after visit h20 given C/o cold sx's worst at night x 1 week

## 2012-04-07 NOTE — Addendum Note (Signed)
Addended by: Cornelius Moras on: 04/07/2012 05:45 PM   Modules accepted: Orders

## 2012-04-07 NOTE — Progress Notes (Signed)
Low back pain, muscular in nature.  No dysuria, fever, contractions, etc. Recommend OTC Motrin 600 mg po Q 6 hours prn, heat, stretching, etc. URI x 1 week, with greenish/bloody nasal mucus, and productive cough.  Denies fever.  Throat slightly sore. Chest clear. Heart RRR without murmur Ears clear except for cerumen Throat slightly red, but no patchy exudate No lymphadenoapthy Mild sinus pain with palpation.  Plan: Rx Zpak, OTC Claritin, other comfort measures. Glucola NV, with Hgb and RPR. Plan NV with AR to discuss planning 3rd C/S (VL would like to assist).

## 2012-04-07 NOTE — Progress Notes (Signed)
[place] today:  22 weeks, EFW 476 gm, 1+1, 48.9%ile.  Cervix 4.47 cm.  Normal fluid and cardiac views.  Posterior placenta

## 2012-04-07 NOTE — Addendum Note (Signed)
Addended by: Cornelius Moras on: 04/07/2012 05:37 PM   Modules accepted: Orders

## 2012-04-26 LAB — US OB FOLLOW UP

## 2012-05-07 ENCOUNTER — Other Ambulatory Visit: Payer: Self-pay

## 2012-05-07 ENCOUNTER — Ambulatory Visit (INDEPENDENT_AMBULATORY_CARE_PROVIDER_SITE_OTHER): Payer: Medicaid Other | Admitting: Obstetrics and Gynecology

## 2012-05-07 VITALS — BP 100/62 | Wt 214.0 lb

## 2012-05-07 DIAGNOSIS — O09219 Supervision of pregnancy with history of pre-term labor, unspecified trimester: Secondary | ICD-10-CM

## 2012-05-07 DIAGNOSIS — Z3490 Encounter for supervision of normal pregnancy, unspecified, unspecified trimester: Secondary | ICD-10-CM

## 2012-05-07 DIAGNOSIS — O09299 Supervision of pregnancy with other poor reproductive or obstetric history, unspecified trimester: Secondary | ICD-10-CM

## 2012-05-07 DIAGNOSIS — N898 Other specified noninflammatory disorders of vagina: Secondary | ICD-10-CM

## 2012-05-07 DIAGNOSIS — Z331 Pregnant state, incidental: Secondary | ICD-10-CM

## 2012-05-07 LAB — POCT WET PREP (WET MOUNT)
Bacteria Wet Prep HPF POC: NEGATIVE
Clue Cells Wet Prep Whiff POC: NEGATIVE

## 2012-05-07 LAB — CBC
HCT: 31.7 % — ABNORMAL LOW (ref 36.0–46.0)
Hemoglobin: 10.7 g/dL — ABNORMAL LOW (ref 12.0–15.0)
MCH: 27.8 pg (ref 26.0–34.0)
MCHC: 33.8 g/dL (ref 30.0–36.0)
MCV: 82.3 fL (ref 78.0–100.0)
Platelets: 214 K/uL (ref 150–400)
RBC: 3.85 MIL/uL — ABNORMAL LOW (ref 3.87–5.11)
RDW: 13.5 % (ref 11.5–15.5)
WBC: 9.2 K/uL (ref 4.0–10.5)

## 2012-05-07 MED ORDER — HYDROXYPROGESTERONE CAPROATE 250 MG/ML IM OIL
250.0000 mg | TOPICAL_OIL | Freq: Once | INTRAMUSCULAR | Status: AC
  Administered 2012-05-07: 250 mg via INTRAMUSCULAR

## 2012-05-07 MED ORDER — VALACYCLOVIR HCL 500 MG PO TABS: 500.0000 mg | ORAL_TABLET | Freq: Every day | ORAL | Status: DC

## 2012-05-07 MED ORDER — CLOTRIMAZOLE-BETAMETHASONE 1-0.05 % EX CREA: TOPICAL_CREAM | Freq: Every day | CUTANEOUS | Status: DC

## 2012-05-07 NOTE — Progress Notes (Signed)
H/o IUFD at 76wks C/o ? Gush last Friday No leaking since C/o itching externally, thinks she has a yeast infxn RTO 2wks Rec u/s at 30wks for EFW and antepartum testing at 32wks secondary to history VBAC consent signed today Requests VL and Me do c-section Rx Lotrisone for vulvitis Ph 4.5, neg nitrazine and neg otherwise, no pooling, nl discharge Pt has decided to do 17P.  H/o PTD at 27wks. Rec Valtrex qd at 34wks

## 2012-05-08 LAB — RPR

## 2012-05-11 ENCOUNTER — Telehealth: Payer: Self-pay

## 2012-05-11 LAB — GLUCOSE TOLERANCE, 1 HOUR

## 2012-05-12 MED ORDER — HYDROXYPROGESTERONE CAPROATE 250 MG/ML IM OIL: 250.0000 mg | TOPICAL_OIL | Freq: Once | INTRAMUSCULAR | Status: DC

## 2012-05-12 NOTE — Addendum Note (Signed)
Addended by: Marla Roe A on: 05/12/2012 12:13 PM   Modules accepted: Orders

## 2012-05-24 ENCOUNTER — Other Ambulatory Visit: Payer: Self-pay

## 2012-05-24 ENCOUNTER — Ambulatory Visit (INDEPENDENT_AMBULATORY_CARE_PROVIDER_SITE_OTHER): Payer: Self-pay | Admitting: Obstetrics and Gynecology

## 2012-05-24 DIAGNOSIS — O09299 Supervision of pregnancy with other poor reproductive or obstetric history, unspecified trimester: Secondary | ICD-10-CM

## 2012-05-24 MED ORDER — HYDROXYPROGESTERONE CAPROATE 250 MG/ML IM OIL
250.0000 mg | TOPICAL_OIL | Freq: Once | INTRAMUSCULAR | Status: AC
  Administered 2012-05-24: 250 mg via INTRAMUSCULAR

## 2012-05-24 NOTE — Progress Notes (Signed)
Office Visit on 05/07/2012  Component Date Value Range Status  . Glucose, Fasting 05/07/2012 TEST NOT PERFORMED  70 - 99 mg/dL Final   Comment: Specimen type inappropriate for ordered test, unable to perform                          test(s).                                                       < 100  mg/dL = normal fasting glucose                          100-125  mg/dL = IFG (impaired fasting glucose)                            > 125  mg/dL = provisional diagnosis of diabetes                             . Glucose, 1 hour 05/07/2012 TEST NOT PERFORMED  70 - 170 mg/dL Final  . RPR 19/14/7829 NON REAC  NON REAC Final  . WBC 05/07/2012 9.2  4.0 - 10.5 K/uL Final  . RBC 05/07/2012 3.85* 3.87 - 5.11 MIL/uL Final  . Hemoglobin 05/07/2012 10.7* 12.0 - 15.0 g/dL Final  . HCT 56/21/3086 31.7* 36.0 - 46.0 % Final  . MCV 05/07/2012 82.3  78.0 - 100.0 fL Final  . MCH 05/07/2012 27.8  26.0 - 34.0 pg Final  . MCHC 05/07/2012 33.8  30.0 - 36.0 g/dL Final  . RDW 57/84/6962 13.5  11.5 - 15.5 % Final  . Platelets 05/07/2012 214  150 - 400 K/uL Final  . Bacteria Wet Prep HPF POC 05/07/2012 neg   Final  . Clue Cells Wet Prep HPF POC 05/07/2012 None   Final  . CLUE CELLS WET PREP WHIFF POC 05/07/2012 Negative Whiff   Final  . Yeast Wet Prep HPF POC 05/07/2012 None   Final  . Trichomonas Wet Prep HPF POC 05/07/2012 neg   Final  . pH 05/07/2012 4.5   Final  . Glucose, 1 Hour GTT 05/07/2012 97  70 - 140 mg/dL Final   C/o hernia at umbilicus - refer to gen surgeon 17P today FKCs RTO 1wk lab visit for 17P and in 2wks for ROB and 17P Start antepartum testing with EFW q4wks at 32wks secondary to h/o IUFD

## 2012-05-26 ENCOUNTER — Telehealth: Payer: Self-pay | Admitting: Obstetrics and Gynecology

## 2012-05-31 ENCOUNTER — Other Ambulatory Visit: Payer: Self-pay

## 2012-06-02 ENCOUNTER — Ambulatory Visit (INDEPENDENT_AMBULATORY_CARE_PROVIDER_SITE_OTHER): Payer: Self-pay | Admitting: Obstetrics and Gynecology

## 2012-06-02 VITALS — BP 110/62 | Wt 218.0 lb

## 2012-06-02 DIAGNOSIS — O09219 Supervision of pregnancy with history of pre-term labor, unspecified trimester: Secondary | ICD-10-CM

## 2012-06-02 DIAGNOSIS — O09299 Supervision of pregnancy with other poor reproductive or obstetric history, unspecified trimester: Secondary | ICD-10-CM

## 2012-06-02 DIAGNOSIS — Q828 Other specified congenital malformations of skin: Secondary | ICD-10-CM

## 2012-06-02 MED ORDER — VALACYCLOVIR HCL 500 MG PO TABS: 500.0000 mg | ORAL_TABLET | Freq: Every day | ORAL | Status: DC

## 2012-06-02 MED ORDER — HYDROXYPROGESTERONE CAPROATE 250 MG/ML IM OIL
250.0000 mg | TOPICAL_OIL | Freq: Once | INTRAMUSCULAR | Status: AC
  Administered 2012-06-02: 250 mg via INTRAMUSCULAR

## 2012-06-02 NOTE — Progress Notes (Signed)
Here for 17P and removal of moles No complaints Cont 17P qwk U/s in 1wks for EFW and BPP secondary to h/o IUFD and then BPP qwk and EFW q4wks Area prepped and lidocaine injected about 2cc of 1% and skin tag excised without difficulty.  Silver Nitrate applied and abx ointment and bandaid applied as well. Pt tolerated well and questions answered.

## 2012-06-07 ENCOUNTER — Encounter: Payer: Self-pay | Admitting: Obstetrics and Gynecology

## 2012-06-07 ENCOUNTER — Other Ambulatory Visit: Payer: Self-pay

## 2012-06-07 DIAGNOSIS — O09299 Supervision of pregnancy with other poor reproductive or obstetric history, unspecified trimester: Secondary | ICD-10-CM

## 2012-06-07 LAB — PATHOLOGY

## 2012-06-10 ENCOUNTER — Ambulatory Visit (INDEPENDENT_AMBULATORY_CARE_PROVIDER_SITE_OTHER): Payer: Self-pay

## 2012-06-10 ENCOUNTER — Ambulatory Visit (INDEPENDENT_AMBULATORY_CARE_PROVIDER_SITE_OTHER): Payer: Self-pay | Admitting: Obstetrics and Gynecology

## 2012-06-10 DIAGNOSIS — Z98891 History of uterine scar from previous surgery: Secondary | ICD-10-CM

## 2012-06-10 DIAGNOSIS — O09299 Supervision of pregnancy with other poor reproductive or obstetric history, unspecified trimester: Secondary | ICD-10-CM

## 2012-06-10 DIAGNOSIS — Z9889 Other specified postprocedural states: Secondary | ICD-10-CM

## 2012-06-10 MED ORDER — HYDROXYPROGESTERONE CAPROATE 250 MG/ML IM OIL
250.0000 mg | TOPICAL_OIL | Freq: Once | INTRAMUSCULAR | Status: AC
  Administered 2012-06-10: 250 mg via INTRAMUSCULAR

## 2012-06-10 NOTE — Progress Notes (Signed)
Ultrasound today estimated fetal weight 4 lbs. 1 oz. 6 at 4th percentile cervix 3.7 cm AFI 20.8 cm BPP 8 out of 8 vertex posterior placenta normal fluid 17P today Cont BPP qwk Questions answered Area where mole was removed is well healed but became hyperpigmented FKCs No complaints Desires Repeat c/s and tubal - sign tubal papers today

## 2012-06-10 NOTE — Addendum Note (Signed)
Addended by: Marla Roe A on: 06/10/2012 12:44 PM   Modules accepted: Orders

## 2012-06-15 ENCOUNTER — Encounter: Payer: Self-pay | Admitting: Obstetrics and Gynecology

## 2012-06-15 ENCOUNTER — Other Ambulatory Visit: Payer: Self-pay

## 2012-06-17 ENCOUNTER — Encounter: Payer: Self-pay | Admitting: Obstetrics and Gynecology

## 2012-06-17 ENCOUNTER — Other Ambulatory Visit: Payer: Self-pay

## 2012-06-17 LAB — US OB FOLLOW UP

## 2012-06-23 ENCOUNTER — Telehealth: Payer: Self-pay | Admitting: Obstetrics and Gynecology

## 2012-06-23 ENCOUNTER — Other Ambulatory Visit: Payer: Self-pay | Admitting: Obstetrics and Gynecology

## 2012-06-23 ENCOUNTER — Encounter: Payer: Self-pay | Admitting: Obstetrics and Gynecology

## 2012-06-23 NOTE — Telephone Encounter (Signed)
Repeat C/S scheduled for 08/10/12 @ 1:30 with AR. -Adrianne Pridgen

## 2012-06-25 ENCOUNTER — Encounter (HOSPITAL_COMMUNITY): Payer: Self-pay

## 2012-06-25 ENCOUNTER — Inpatient Hospital Stay (HOSPITAL_COMMUNITY)
Admission: AD | Admit: 2012-06-25 | Discharge: 2012-06-26 | Disposition: A | Discharge status: Home / Self Care | Payer: Medicaid Other | Source: Ambulatory Visit | Attending: Obstetrics and Gynecology | Admitting: Obstetrics and Gynecology

## 2012-06-25 DIAGNOSIS — O09299 Supervision of pregnancy with other poor reproductive or obstetric history, unspecified trimester: Secondary | ICD-10-CM

## 2012-06-25 DIAGNOSIS — O4700 False labor before 37 completed weeks of gestation, unspecified trimester: Secondary | ICD-10-CM

## 2012-06-25 DIAGNOSIS — N760 Acute vaginitis: Secondary | ICD-10-CM

## 2012-06-25 DIAGNOSIS — R197 Diarrhea, unspecified: Secondary | ICD-10-CM

## 2012-06-25 DIAGNOSIS — Z98891 History of uterine scar from previous surgery: Secondary | ICD-10-CM

## 2012-06-25 DIAGNOSIS — Z3689 Encounter for other specified antenatal screening: Secondary | ICD-10-CM

## 2012-06-25 LAB — URINALYSIS, ROUTINE W REFLEX MICROSCOPIC
Bilirubin Urine: NEGATIVE
Glucose, UA: NEGATIVE mg/dL
Ketones, ur: NEGATIVE mg/dL
Nitrite: NEGATIVE
Specific Gravity, Urine: 1.02 (ref 1.005–1.030)
pH: 6 (ref 5.0–8.0)

## 2012-06-25 LAB — URINE MICROSCOPIC-ADD ON

## 2012-06-25 LAB — WET PREP, GENITAL
Trich, Wet Prep: NONE SEEN
Yeast Wet Prep HPF POC: NONE SEEN

## 2012-06-25 LAB — FETAL FIBRONECTIN: Fetal Fibronectin: NEGATIVE

## 2012-06-25 MED ORDER — ALBUTEROL SULFATE (5 MG/ML) 0.5% IN NEBU: 2.5000 mg | INHALATION_SOLUTION | Freq: Once | RESPIRATORY_TRACT | Status: DC

## 2012-06-25 MED ORDER — DIPHENOXYLATE-ATROPINE 2.5-0.025 MG PO TABS
2.0000 | ORAL_TABLET | Freq: Once | ORAL | Status: AC
  Administered 2012-06-25: 2 via ORAL
  Filled 2012-06-25: qty 2

## 2012-06-25 NOTE — MAU Note (Signed)
Patient is in with c/o leaking clear vaginal discharge and intermittent contractions. She denies any vaginal bleeding and reports good fetal movement. She is on weekly 17p injection.
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
## 2012-06-25 NOTE — MAU Note (Signed)
I"ve been having a lot of contractions for 3 days. Been leaking some fld today and unsure if could be urine.

## 2012-06-26 ENCOUNTER — Inpatient Hospital Stay (HOSPITAL_COMMUNITY): Payer: Medicaid Other

## 2012-06-26 MED ORDER — METRONIDAZOLE 0.75 % VA GEL: 1.0000 | Freq: Every day | VAGINAL | Status: AC

## 2012-06-26 NOTE — Progress Notes (Signed)
Results for orders placed during the hospital encounter of 06/25/12 (from the past 48 hour(s))  URINALYSIS, ROUTINE W REFLEX MICROSCOPIC     Status: Abnormal   Collection Time   06/25/12 10:25 PM      Component Value Range Comment   Color, Urine YELLOW  YELLOW    APPearance CLEAR  CLEAR    Specific Gravity, Urine 1.020  1.005 - 1.030    pH 6.0  5.0 - 8.0    Glucose, UA NEGATIVE  NEGATIVE mg/dL    Hgb urine dipstick NEGATIVE  NEGATIVE    Bilirubin Urine NEGATIVE  NEGATIVE    Ketones, ur NEGATIVE  NEGATIVE mg/dL    Protein, ur NEGATIVE  NEGATIVE mg/dL    Urobilinogen, UA 0.2  0.0 - 1.0 mg/dL    Nitrite NEGATIVE  NEGATIVE    Leukocytes, UA TRACE (*) NEGATIVE   URINE MICROSCOPIC-ADD ON     Status: Abnormal   Collection Time   06/25/12 10:25 PM      Component Value Range Comment   Squamous Epithelial / LPF FEW (*) RARE    WBC, UA 3-6  <3 WBC/hpf    Bacteria, UA RARE  RARE    Crystals CA OXALATE CRYSTALS (*) NEGATIVE    Urine-Other MUCOUS PRESENT     FETAL FIBRONECTIN     Status: Normal   Collection Time   06/25/12 11:00 PM      Component Value Range Comment   Fetal Fibronectin NEGATIVE  NEGATIVE   AMNISURE RUPTURE OF MEMBRANE (ROM)     Status: Normal   Collection Time   06/25/12 11:00 PM      Component Value Range Comment   Amnisure ROM NEGATIVE     WET PREP, GENITAL     Status: Abnormal   Collection Time   06/25/12 11:00 PM      Component Value Range Comment   Yeast Wet Prep HPF POC NONE SEEN  NONE SEEN    Trich, Wet Prep NONE SEEN  NONE SEEN    Clue Cells Wet Prep HPF POC MANY (*) NONE SEEN    WBC, Wet Prep HPF POC MODERATE (*) NONE SEEN MANY BACTERIA SEEN   RADIOLOGY REPORT*  Clinical Data: 33-week gestation with contractions, leaking  amniotic fluid.  LIMITED OBSTETRIC ULTRASOUND WITH BIOPHYSICAL PROFILE  Number of Fetuses: 1  Heart Rate: 144 bpm  Movement: Present.  Presentation: Cephalic.  Placental Location: Posterior.  Previa: Not present.  Amniotic Fluid  (Subjective): Normal.  Vertical pocket: 10.4cm  BPD: 8.6cm 34w 4d EDC: 08/03/2012  MATERNAL FINDINGS:  Cervix: Closed, measuring 3.2 cm in length.  Uterus/Adnexae: No abnormalities identified.  BIOPHYSICAL PROFILE  Movement: 2 Time: 14 minutes  Breathing: 2  Tone: 2  Amniotic Fluid: 2  Total Score: 8/8  IMPRESSION:  Unremarkable limited obstetric ultrasound demonstrating no  abnormalities. Biophysical profile is normal. Estimated  gestational age by ultrasound is 34 weeks and 4 days.  UC resolved, OTC antidiarrhea med, s/s ptl, kick counts, call office for f/o appt on Tuesday. Collaboration with Dr. Su Hilt. Discharge home. [name], CNM

## 2012-06-30 ENCOUNTER — Encounter: Payer: Self-pay | Admitting: Obstetrics and Gynecology

## 2012-06-30 ENCOUNTER — Ambulatory Visit: Payer: Self-pay

## 2012-06-30 ENCOUNTER — Other Ambulatory Visit: Payer: Self-pay

## 2012-06-30 ENCOUNTER — Other Ambulatory Visit: Payer: Self-pay | Admitting: Obstetrics and Gynecology

## 2012-06-30 ENCOUNTER — Ambulatory Visit (INDEPENDENT_AMBULATORY_CARE_PROVIDER_SITE_OTHER): Payer: Medicaid Other | Admitting: Obstetrics and Gynecology

## 2012-06-30 ENCOUNTER — Ambulatory Visit (INDEPENDENT_AMBULATORY_CARE_PROVIDER_SITE_OTHER): Payer: Medicaid Other

## 2012-06-30 VITALS — BP 118/62 | Wt 223.0 lb

## 2012-06-30 DIAGNOSIS — O09299 Supervision of pregnancy with other poor reproductive or obstetric history, unspecified trimester: Secondary | ICD-10-CM

## 2012-06-30 DIAGNOSIS — Z331 Pregnant state, incidental: Secondary | ICD-10-CM

## 2012-06-30 DIAGNOSIS — O4700 False labor before 37 completed weeks of gestation, unspecified trimester: Secondary | ICD-10-CM

## 2012-06-30 DIAGNOSIS — O409XX0 Polyhydramnios, unspecified trimester, not applicable or unspecified: Secondary | ICD-10-CM | POA: Insufficient documentation

## 2012-06-30 LAB — US OB FOLLOW UP

## 2012-06-30 MED ORDER — HYDROXYPROGESTERONE CAPROATE 250 MG/ML IM OIL
250.0000 mg | TOPICAL_OIL | Freq: Once | INTRAMUSCULAR | Status: AC
  Administered 2012-06-30: 250 mg via INTRAMUSCULAR

## 2012-06-30 NOTE — Progress Notes (Signed)
[redacted]w[redacted]d Ultrasound shows:  SIUP  S=D     [place] EDD: 08/06/2012           AFI: 30.94 EFW: 5 lb 5 oz           Cervical length: 4.03 cm           Placenta localization: posterior           Fetal presentation: vertex                   Anatomy survey is normal           Gender : female Comments: vertex presentation, posterior placenta, AFI is polyhydramnios (>97th%) BPP = 8/8

## 2012-06-30 NOTE — Progress Notes (Signed)
[redacted]w[redacted]d Sono: polyhydramnios : reviewed with pt of FOB, plan weekly ultrasound Sporadic contractions, max 4/hour

## 2012-07-02 ENCOUNTER — Telehealth: Payer: Self-pay | Admitting: Obstetrics and Gynecology

## 2012-07-02 NOTE — Telephone Encounter (Signed)
Ar pt 

## 2012-07-05 ENCOUNTER — Telehealth: Payer: Self-pay

## 2012-07-05 NOTE — Telephone Encounter (Signed)
Per AR,  Note was prepared for pt that she can have a cushioned standing mat to ease foot and leg discomfort at work. [name]

## 2012-07-06 ENCOUNTER — Telehealth: Payer: Self-pay | Admitting: Obstetrics and Gynecology

## 2012-07-06 ENCOUNTER — Other Ambulatory Visit: Payer: Self-pay | Admitting: Obstetrics and Gynecology

## 2012-07-06 MED ORDER — FLUCONAZOLE 100 MG PO TABS: 100.0000 mg | ORAL_TABLET | Freq: Every day | ORAL | Status: DC

## 2012-07-06 NOTE — Telephone Encounter (Signed)
Patient has called to ask if she could have an alternative medication for yeast infection. She had been to the office yesterday and was prescribed Terazol cream which she dislikes. Called in prescriptionof Diflucan 100 mgs po x 1 for this lady. F/U in 2 weeks at [facility] for scheduled OB appt

## 2012-07-07 ENCOUNTER — Other Ambulatory Visit: Payer: Self-pay

## 2012-07-07 DIAGNOSIS — O09299 Supervision of pregnancy with other poor reproductive or obstetric history, unspecified trimester: Secondary | ICD-10-CM

## 2012-07-08 ENCOUNTER — Encounter: Payer: Medicaid Other | Admitting: Obstetrics and Gynecology

## 2012-07-08 ENCOUNTER — Ambulatory Visit (INDEPENDENT_AMBULATORY_CARE_PROVIDER_SITE_OTHER): Payer: Medicaid Other

## 2012-07-08 ENCOUNTER — Ambulatory Visit (INDEPENDENT_AMBULATORY_CARE_PROVIDER_SITE_OTHER): Payer: Medicaid Other | Admitting: Obstetrics and Gynecology

## 2012-07-08 ENCOUNTER — Encounter: Payer: Self-pay | Admitting: Obstetrics and Gynecology

## 2012-07-08 VITALS — BP 120/66 | Wt 224.0 lb

## 2012-07-08 DIAGNOSIS — Z331 Pregnant state, incidental: Secondary | ICD-10-CM

## 2012-07-08 DIAGNOSIS — O409XX0 Polyhydramnios, unspecified trimester, not applicable or unspecified: Secondary | ICD-10-CM

## 2012-07-08 DIAGNOSIS — IMO0002 Reserved for concepts with insufficient information to code with codable children: Secondary | ICD-10-CM

## 2012-07-08 DIAGNOSIS — O09299 Supervision of pregnancy with other poor reproductive or obstetric history, unspecified trimester: Secondary | ICD-10-CM

## 2012-07-08 DIAGNOSIS — O09219 Supervision of pregnancy with history of pre-term labor, unspecified trimester: Secondary | ICD-10-CM

## 2012-07-08 MED ORDER — HYDROXYPROGESTERONE CAPROATE 250 MG/ML IM OIL
250.0000 mg | TOPICAL_OIL | Freq: Once | INTRAMUSCULAR | Status: AC
  Administered 2012-07-08: 250 mg via INTRAMUSCULAR

## 2012-07-08 NOTE — Patient Instructions (Signed)

## 2012-07-08 NOTE — Progress Notes (Signed)
[redacted]w[redacted]d Pt c/o very painful ctx's BPP hx IUFD Posterior placenta AFI 75%tile BPP 8/8 16 mins

## 2012-07-08 NOTE — Progress Notes (Signed)
[redacted]w[redacted]d C/o ctx, feels in upper abdomen and back Has repeat c/s scheduled Requests VE -cl/th/high 17p today - 1 more 17p in 1 wk GBS today  BPP and AFI check weekly, secondary to hx of neonatal death and current polyhydramnios  BPP today 8/8  AFI = 75%  Hx HSV - on valtrex  Pt needs growth [place] at 37wks  rv'd FKC and labor sx's

## 2012-07-09 ENCOUNTER — Telehealth: Payer: Self-pay | Admitting: Obstetrics and Gynecology

## 2012-07-09 NOTE — Telephone Encounter (Signed)
Repeat C/S scheduled for 08/05/12 @ 11:00 with AVS. -Adrianne Pridgen

## 2012-07-13 ENCOUNTER — Ambulatory Visit (INDEPENDENT_AMBULATORY_CARE_PROVIDER_SITE_OTHER): Payer: Medicaid Other | Admitting: Obstetrics and Gynecology

## 2012-07-13 ENCOUNTER — Ambulatory Visit (INDEPENDENT_AMBULATORY_CARE_PROVIDER_SITE_OTHER): Payer: Medicaid Other

## 2012-07-13 VITALS — BP 104/62 | Wt 224.0 lb

## 2012-07-13 DIAGNOSIS — IMO0002 Reserved for concepts with insufficient information to code with codable children: Secondary | ICD-10-CM

## 2012-07-13 DIAGNOSIS — O409XX0 Polyhydramnios, unspecified trimester, not applicable or unspecified: Secondary | ICD-10-CM

## 2012-07-13 DIAGNOSIS — Z331 Pregnant state, incidental: Secondary | ICD-10-CM

## 2012-07-13 NOTE — Progress Notes (Signed)
[redacted]w[redacted]d GFM  More contractions  No LOF No bleeding Polyhydramnios unchanged

## 2012-07-13 NOTE — Progress Notes (Signed)
Pt stated no issues today.  

## 2012-07-13 NOTE — Progress Notes (Signed)
GBS positive   Sono:   AFI:  28.16 cm  BPP 8/8    Vertex presentation, posterior placenta, AFI is polyhydramnios (97th %tile)

## 2012-07-19 ENCOUNTER — Other Ambulatory Visit: Payer: Self-pay | Admitting: Obstetrics and Gynecology

## 2012-07-19 ENCOUNTER — Encounter: Payer: Self-pay | Admitting: Obstetrics and Gynecology

## 2012-07-19 ENCOUNTER — Ambulatory Visit (INDEPENDENT_AMBULATORY_CARE_PROVIDER_SITE_OTHER): Payer: Medicaid Other

## 2012-07-19 ENCOUNTER — Ambulatory Visit (INDEPENDENT_AMBULATORY_CARE_PROVIDER_SITE_OTHER): Payer: Medicaid Other | Admitting: Obstetrics and Gynecology

## 2012-07-19 VITALS — BP 106/68 | Wt 226.0 lb

## 2012-07-19 DIAGNOSIS — Z8751 Personal history of pre-term labor: Secondary | ICD-10-CM

## 2012-07-19 DIAGNOSIS — IMO0002 Reserved for concepts with insufficient information to code with codable children: Secondary | ICD-10-CM

## 2012-07-19 DIAGNOSIS — O409XX0 Polyhydramnios, unspecified trimester, not applicable or unspecified: Secondary | ICD-10-CM

## 2012-07-19 NOTE — Progress Notes (Signed)
[redacted]w[redacted]d BPP today - The PDA had a vertex posterior placenta and normal fluid and an AFI of 21.5 cm 80% estimated fetal weight 7 lbs. 7 oz. Repeat BPP in 1wk. In the event that the BPP is nl with nl AFI at next visit, will no longer need weekly BPP Requests mole on right inner thigh to be removed FKCs and Labor Precautions

## 2012-07-20 ENCOUNTER — Telehealth: Payer: Self-pay

## 2012-07-20 LAB — US OB FOLLOW UP

## 2012-07-20 NOTE — Telephone Encounter (Signed)
LM for Shana to R/S this pt's mole removal for 30 mins in procedure room with no overbookings. It was booked for only ten mins. [name]

## 2012-07-22 ENCOUNTER — Encounter (HOSPITAL_COMMUNITY): Payer: Self-pay | Admitting: Pharmacist

## 2012-07-26 ENCOUNTER — Other Ambulatory Visit: Payer: Self-pay | Admitting: Obstetrics and Gynecology

## 2012-07-26 DIAGNOSIS — O409XX0 Polyhydramnios, unspecified trimester, not applicable or unspecified: Secondary | ICD-10-CM

## 2012-07-26 DIAGNOSIS — IMO0002 Reserved for concepts with insufficient information to code with codable children: Secondary | ICD-10-CM

## 2012-07-27 ENCOUNTER — Ambulatory Visit: Payer: Medicaid Other

## 2012-07-27 ENCOUNTER — Inpatient Hospital Stay (HOSPITAL_COMMUNITY)
Admission: AD | Admit: 2012-07-27 | Discharge: 2012-07-27 | Disposition: A | Discharge status: Home / Self Care | Payer: Medicaid Other | Source: Ambulatory Visit | Attending: Obstetrics and Gynecology | Admitting: Obstetrics and Gynecology

## 2012-07-27 ENCOUNTER — Ambulatory Visit (INDEPENDENT_AMBULATORY_CARE_PROVIDER_SITE_OTHER): Payer: Medicaid Other | Admitting: Obstetrics and Gynecology

## 2012-07-27 ENCOUNTER — Encounter: Payer: Medicaid Other | Admitting: Obstetrics and Gynecology

## 2012-07-27 ENCOUNTER — Encounter (HOSPITAL_COMMUNITY): Payer: Self-pay | Admitting: *Deleted

## 2012-07-27 ENCOUNTER — Ambulatory Visit (INDEPENDENT_AMBULATORY_CARE_PROVIDER_SITE_OTHER): Payer: Medicaid Other

## 2012-07-27 VITALS — BP 104/62 | Wt 224.0 lb

## 2012-07-27 DIAGNOSIS — Z98891 History of uterine scar from previous surgery: Secondary | ICD-10-CM

## 2012-07-27 DIAGNOSIS — O409XX0 Polyhydramnios, unspecified trimester, not applicable or unspecified: Secondary | ICD-10-CM

## 2012-07-27 DIAGNOSIS — O09299 Supervision of pregnancy with other poor reproductive or obstetric history, unspecified trimester: Secondary | ICD-10-CM

## 2012-07-27 DIAGNOSIS — IMO0002 Reserved for concepts with insufficient information to code with codable children: Secondary | ICD-10-CM

## 2012-07-27 DIAGNOSIS — O479 False labor, unspecified: Secondary | ICD-10-CM | POA: Insufficient documentation

## 2012-07-27 NOTE — MAU Note (Signed)
PT SAYS HAVING UC- HURT BAD ALL DAY.    WENT TO OFFICE TODAY- VE - 4 CM.      DENIES HSV AND MRSA

## 2012-07-27 NOTE — Progress Notes (Signed)
[redacted]w[redacted]d Ultrasound today AFI 20.1 cm BPP of 8/8 posterior placenta vertex presentation NI is the 20th percentile Repeat c/s scheduled for 10/15 (and BTL) with AVS BPP on Mon or Tues with AVS FKCs and Labor Precautions Declined monitoring and recheck
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
## 2012-07-30 ENCOUNTER — Encounter (HOSPITAL_COMMUNITY): Payer: Self-pay | Admitting: *Deleted

## 2012-07-30 ENCOUNTER — Inpatient Hospital Stay (HOSPITAL_COMMUNITY)
Admission: AD | Admit: 2012-07-30 | Discharge: 2012-07-30 | Disposition: A | Discharge status: Home / Self Care | Payer: Medicaid Other | Source: Ambulatory Visit | Attending: Obstetrics and Gynecology | Admitting: Obstetrics and Gynecology

## 2012-07-30 DIAGNOSIS — O479 False labor, unspecified: Secondary | ICD-10-CM

## 2012-07-30 DIAGNOSIS — O99891 Other specified diseases and conditions complicating pregnancy: Secondary | ICD-10-CM | POA: Insufficient documentation

## 2012-07-30 DIAGNOSIS — R109 Unspecified abdominal pain: Secondary | ICD-10-CM

## 2012-07-30 LAB — AMNISURE RUPTURE OF MEMBRANE (ROM) NOT AT ARMC: Amnisure ROM: NEGATIVE
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
## 2012-07-30 NOTE — MAU Note (Signed)
Leaked fld twice since 12noon. One of those times I coughed. Feels like a lot of pressure down there and my stomach is hurting.

## 2012-08-02 ENCOUNTER — Encounter (HOSPITAL_COMMUNITY): Payer: Self-pay

## 2012-08-02 ENCOUNTER — Encounter (HOSPITAL_COMMUNITY)
Admission: RE | Admit: 2012-08-02 | Discharge: 2012-08-02 | Disposition: A | Discharge status: Home / Self Care | Payer: Medicaid Other | Source: Ambulatory Visit | Attending: Obstetrics and Gynecology | Admitting: Obstetrics and Gynecology

## 2012-08-02 VITALS — BP 112/87 | Ht 64.0 in | Wt 220.0 lb

## 2012-08-02 DIAGNOSIS — Z98891 History of uterine scar from previous surgery: Secondary | ICD-10-CM

## 2012-08-02 DIAGNOSIS — O09299 Supervision of pregnancy with other poor reproductive or obstetric history, unspecified trimester: Secondary | ICD-10-CM

## 2012-08-02 DIAGNOSIS — O409XX0 Polyhydramnios, unspecified trimester, not applicable or unspecified: Secondary | ICD-10-CM

## 2012-08-02 LAB — TYPE AND SCREEN

## 2012-08-02 LAB — CBC
MCHC: 32.6 g/dL (ref 30.0–36.0)
Platelets: 164 10*3/uL (ref 150–400)
RDW: 14.3 % (ref 11.5–15.5)
WBC: 7.6 10*3/uL (ref 4.0–10.5)

## 2012-08-02 LAB — SURGICAL PCR SCREEN
MRSA, PCR: NEGATIVE
Staphylococcus aureus: NEGATIVE

## 2012-08-02 LAB — RPR: RPR Ser Ql: NONREACTIVE

## 2012-08-02 NOTE — Patient Instructions (Addendum)
20 [name]  08/02/2012   Your procedure is scheduled on:  08/05/12  Enter through the Main Entrance of [facility] at 930 AM.  Pick up the phone at the desk and dial [phone].   Call this number if you have problems the morning of surgery: [phone]   Remember:   Do not eat food:After Midnight.  Do not drink clear liquids: after 7AM  Take these medicines the morning of surgery with A SIP OF WATER: NA   Do not wear jewelry, make-up or nail polish.  Do not wear lotions, powders, or perfumes. You may wear deodorant.  Do not shave 48 hours prior to surgery.  Do not bring valuables to the hospital.  Contacts, dentures or bridgework may not be worn into surgery.  Leave suitcase in the car. After surgery it may be brought to your room.  For patients admitted to the hospital, checkout time is 11:00 AM the day of discharge.   Patients discharged the day of surgery will not be allowed to drive home.  Name and phone number of your driver: NA  Special Instructions: Shower using CHG 2 nights before surgery and the night before surgery.  If you shower the day of surgery use CHG.  Use special wash - you have one bottle of CHG for all showers.  You should use approximately 1/3 of the bottle for each shower.   Please read over the following fact sheets that you were given: MRSA Information

## 2012-08-04 ENCOUNTER — Telehealth: Payer: Self-pay | Admitting: Obstetrics and Gynecology

## 2012-08-04 MED ORDER — GENTAMICIN SULFATE 40 MG/ML IJ SOLN
INTRAVENOUS | Status: AC
  Administered 2012-08-05: 100 mL via INTRAVENOUS
  Filled 2012-08-04: qty 12.59
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
## 2012-08-04 NOTE — Telephone Encounter (Addendum)
Patient called.  Husband says that she is doing well. Not at home now.  Dr. Stefano Gaul

## 2012-08-05 ENCOUNTER — Encounter (HOSPITAL_COMMUNITY): Payer: Self-pay | Admitting: *Deleted

## 2012-08-05 ENCOUNTER — Inpatient Hospital Stay (HOSPITAL_COMMUNITY): Payer: Medicaid Other

## 2012-08-05 ENCOUNTER — Encounter (HOSPITAL_COMMUNITY): Payer: Self-pay

## 2012-08-05 ENCOUNTER — Inpatient Hospital Stay (HOSPITAL_COMMUNITY)
Admission: AD | Admit: 2012-08-05 | Discharge: 2012-08-08 | DRG: 765 | Disposition: A | Discharge status: Home / Self Care | Payer: Medicaid Other | Source: Ambulatory Visit | Attending: Obstetrics and Gynecology | Admitting: Obstetrics and Gynecology

## 2012-08-05 ENCOUNTER — Telehealth: Payer: Self-pay | Admitting: Obstetrics and Gynecology

## 2012-08-05 ENCOUNTER — Encounter (HOSPITAL_COMMUNITY): Payer: Self-pay | Admitting: Registered Nurse

## 2012-08-05 ENCOUNTER — Encounter (HOSPITAL_COMMUNITY)
Admission: AD | Disposition: A | Discharge status: Home / Self Care | Payer: Self-pay | Source: Ambulatory Visit | Attending: Obstetrics and Gynecology

## 2012-08-05 DIAGNOSIS — A6000 Herpesviral infection of urogenital system, unspecified: Secondary | ICD-10-CM | POA: Diagnosis present

## 2012-08-05 DIAGNOSIS — O98519 Other viral diseases complicating pregnancy, unspecified trimester: Secondary | ICD-10-CM | POA: Diagnosis present

## 2012-08-05 DIAGNOSIS — Z01812 Encounter for preprocedural laboratory examination: Secondary | ICD-10-CM

## 2012-08-05 DIAGNOSIS — O99214 Obesity complicating childbirth: Secondary | ICD-10-CM | POA: Diagnosis present

## 2012-08-05 DIAGNOSIS — Z01818 Encounter for other preprocedural examination: Secondary | ICD-10-CM

## 2012-08-05 DIAGNOSIS — E669 Obesity, unspecified: Secondary | ICD-10-CM | POA: Diagnosis present

## 2012-08-05 DIAGNOSIS — O34219 Maternal care for unspecified type scar from previous cesarean delivery: Secondary | ICD-10-CM

## 2012-08-05 DIAGNOSIS — D649 Anemia, unspecified: Secondary | ICD-10-CM | POA: Diagnosis present

## 2012-08-05 DIAGNOSIS — O09299 Supervision of pregnancy with other poor reproductive or obstetric history, unspecified trimester: Secondary | ICD-10-CM

## 2012-08-05 DIAGNOSIS — Z98891 History of uterine scar from previous surgery: Secondary | ICD-10-CM | POA: Diagnosis not present

## 2012-08-05 DIAGNOSIS — O9902 Anemia complicating childbirth: Secondary | ICD-10-CM

## 2012-08-05 DIAGNOSIS — O409XX0 Polyhydramnios, unspecified trimester, not applicable or unspecified: Secondary | ICD-10-CM

## 2012-08-05 DIAGNOSIS — G4700 Insomnia, unspecified: Secondary | ICD-10-CM | POA: Clinically undetermined

## 2012-08-05 DIAGNOSIS — Z22330 Carrier of Group B streptococcus: Secondary | ICD-10-CM

## 2012-08-05 DIAGNOSIS — O9903 Anemia complicating the puerperium: Secondary | ICD-10-CM | POA: Diagnosis not present

## 2012-08-05 DIAGNOSIS — O99892 Other specified diseases and conditions complicating childbirth: Secondary | ICD-10-CM | POA: Diagnosis present

## 2012-08-05 HISTORY — DX: Anemia, unspecified: D64.9

## 2012-08-05 LAB — CBC
MCV: 80.3 fL (ref 78.0–100.0)
Platelets: 183 10*3/uL (ref 150–400)
RDW: 14.5 % (ref 11.5–15.5)
WBC: 8.6 10*3/uL (ref 4.0–10.5)

## 2012-08-05 SURGERY — Surgical Case
Anesthesia: Spinal | Site: Abdomen | Wound class: Clean Contaminated

## 2012-08-05 MED ORDER — KETOROLAC TROMETHAMINE 30 MG/ML IJ SOLN
30.0000 mg | Freq: Four times a day (QID) | INTRAMUSCULAR | Status: AC | PRN
  Administered 2012-08-05: 30 mg via INTRAVENOUS

## 2012-08-05 MED ORDER — PHENYLEPHRINE 40 MCG/ML (10ML) SYRINGE FOR IV PUSH (FOR BLOOD PRESSURE SUPPORT)
PREFILLED_SYRINGE | INTRAVENOUS | Status: AC
  Filled 2012-08-05: qty 5

## 2012-08-05 MED ORDER — MEDROXYPROGESTERONE ACETATE 150 MG/ML IM SUSP: 150.0000 mg | INTRAMUSCULAR | Status: DC | PRN

## 2012-08-05 MED ORDER — KETOROLAC TROMETHAMINE 30 MG/ML IJ SOLN
INTRAMUSCULAR | Status: AC
  Administered 2012-08-05: 30 mg via INTRAVENOUS
  Filled 2012-08-05: qty 1

## 2012-08-05 MED ORDER — ONDANSETRON HCL 4 MG PO TABS: 4.0000 mg | ORAL_TABLET | ORAL | Status: DC | PRN

## 2012-08-05 MED ORDER — TETANUS-DIPHTH-ACELL PERTUSSIS 5-2.5-18.5 LF-MCG/0.5 IM SUSP
0.5000 mL | Freq: Once | INTRAMUSCULAR | Status: AC
  Administered 2012-08-07: 0.5 mL via INTRAMUSCULAR
  Filled 2012-08-05: qty 0.5

## 2012-08-05 MED ORDER — SODIUM CHLORIDE 0.9 % IJ SOLN
3.0000 mL | INTRAMUSCULAR | Status: DC | PRN
  Administered 2012-08-07 – 2012-08-08 (×2): 3 mL via INTRAVENOUS

## 2012-08-05 MED ORDER — LACTATED RINGERS IV SOLN
INTRAVENOUS | Status: DC
  Administered 2012-08-05: 18:00:00 via INTRAVENOUS

## 2012-08-05 MED ORDER — DIPHENHYDRAMINE HCL 50 MG/ML IJ SOLN
12.5000 mg | INTRAMUSCULAR | Status: DC | PRN
  Administered 2012-08-05: 12.5 mg via INTRAVENOUS
  Filled 2012-08-05: qty 1

## 2012-08-05 MED ORDER — FENTANYL CITRATE 0.05 MG/ML IJ SOLN
INTRAMUSCULAR | Status: DC | PRN
  Administered 2012-08-05: 25 ug via INTRATHECAL

## 2012-08-05 MED ORDER — NALBUPHINE HCL 10 MG/ML IJ SOLN
5.0000 mg | INTRAMUSCULAR | Status: DC | PRN
  Administered 2012-08-05: 10 mg via SUBCUTANEOUS
  Administered 2012-08-06: 5 mg via SUBCUTANEOUS
  Filled 2012-08-05: qty 1

## 2012-08-05 MED ORDER — WITCH HAZEL-GLYCERIN EX PADS: 1.0000 "application " | MEDICATED_PAD | CUTANEOUS | Status: DC | PRN

## 2012-08-05 MED ORDER — MEASLES, MUMPS & RUBELLA VAC ~~LOC~~ INJ
0.5000 mL | INJECTION | Freq: Once | SUBCUTANEOUS | Status: DC
  Filled 2012-08-05: qty 0.5

## 2012-08-05 MED ORDER — LACTATED RINGERS IV SOLN
INTRAVENOUS | Status: DC
  Administered 2012-08-05 (×3): via INTRAVENOUS

## 2012-08-05 MED ORDER — NALOXONE HCL 0.4 MG/ML IJ SOLN: 0.4000 mg | INTRAMUSCULAR | Status: DC | PRN

## 2012-08-05 MED ORDER — NALBUPHINE HCL 10 MG/ML IJ SOLN
5.0000 mg | INTRAMUSCULAR | Status: DC | PRN
  Administered 2012-08-05 – 2012-08-06 (×2): 5 mg via INTRAVENOUS
  Filled 2012-08-05 (×5): qty 1

## 2012-08-05 MED ORDER — OXYTOCIN 40 UNITS IN LACTATED RINGERS INFUSION - SIMPLE MED: 62.5000 mL/h | INTRAVENOUS | Status: AC

## 2012-08-05 MED ORDER — ACETAMINOPHEN 10 MG/ML IV SOLN: 1000.0000 mg | Freq: Four times a day (QID) | INTRAVENOUS | Status: AC | PRN

## 2012-08-05 MED ORDER — PRENATAL MULTIVITAMIN CH
1.0000 | ORAL_TABLET | Freq: Every day | ORAL | Status: DC
  Filled 2012-08-05 (×2): qty 1

## 2012-08-05 MED ORDER — SCOPOLAMINE 1 MG/3DAYS TD PT72
MEDICATED_PATCH | TRANSDERMAL | Status: AC
  Administered 2012-08-05: 1.5 mg via TRANSDERMAL
  Filled 2012-08-05: qty 1

## 2012-08-05 MED ORDER — ONDANSETRON HCL 4 MG/2ML IJ SOLN: 4.0000 mg | INTRAMUSCULAR | Status: DC | PRN

## 2012-08-05 MED ORDER — MORPHINE SULFATE (PF) 0.5 MG/ML IJ SOLN
INTRAMUSCULAR | Status: DC | PRN
  Administered 2012-08-05: .2 mg via INTRATHECAL

## 2012-08-05 MED ORDER — LANOLIN HYDROUS EX OINT: 1.0000 "application " | TOPICAL_OINTMENT | CUTANEOUS | Status: DC | PRN

## 2012-08-05 MED ORDER — SCOPOLAMINE 1 MG/3DAYS TD PT72
1.0000 | MEDICATED_PATCH | Freq: Once | TRANSDERMAL | Status: DC
  Filled 2012-08-05: qty 1

## 2012-08-05 MED ORDER — OXYTOCIN 10 UNIT/ML IJ SOLN
40.0000 [IU] | INTRAVENOUS | Status: DC | PRN
  Administered 2012-08-05: 40 [IU] via INTRAVENOUS

## 2012-08-05 MED ORDER — SCOPOLAMINE 1 MG/3DAYS TD PT72
1.0000 | MEDICATED_PATCH | Freq: Once | TRANSDERMAL | Status: DC
Start: 2012-08-05 — End: 2012-08-05
  Administered 2012-08-05: 1.5 mg via TRANSDERMAL

## 2012-08-05 MED ORDER — EPHEDRINE 5 MG/ML INJ
INTRAVENOUS | Status: AC
  Filled 2012-08-05: qty 10

## 2012-08-05 MED ORDER — DIPHENHYDRAMINE HCL 50 MG/ML IJ SOLN: 25.0000 mg | INTRAMUSCULAR | Status: DC | PRN

## 2012-08-05 MED ORDER — ZOLPIDEM TARTRATE 5 MG PO TABS: 5.0000 mg | ORAL_TABLET | Freq: Every evening | ORAL | Status: DC | PRN

## 2012-08-05 MED ORDER — LACTATED RINGERS IV SOLN
INTRAVENOUS | Status: DC | PRN
  Administered 2012-08-05: 12:00:00 via INTRAVENOUS

## 2012-08-05 MED ORDER — SIMETHICONE 80 MG PO CHEW: 80.0000 mg | CHEWABLE_TABLET | ORAL | Status: DC | PRN

## 2012-08-05 MED ORDER — MEPERIDINE HCL 25 MG/ML IJ SOLN: 6.2500 mg | INTRAMUSCULAR | Status: DC | PRN

## 2012-08-05 MED ORDER — MORPHINE SULFATE 0.5 MG/ML IJ SOLN
INTRAMUSCULAR | Status: AC
  Filled 2012-08-05: qty 10

## 2012-08-05 MED ORDER — SENNOSIDES-DOCUSATE SODIUM 8.6-50 MG PO TABS
2.0000 | ORAL_TABLET | Freq: Every day | ORAL | Status: DC
  Administered 2012-08-06 – 2012-08-07 (×2): 2 via ORAL

## 2012-08-05 MED ORDER — BUPIVACAINE-EPINEPHRINE (PF) 0.5% -1:200000 IJ SOLN
INTRAMUSCULAR | Status: AC
  Filled 2012-08-05: qty 10

## 2012-08-05 MED ORDER — NALBUPHINE SYRINGE 5 MG/0.5 ML
INJECTION | INTRAMUSCULAR | Status: AC
  Administered 2012-08-05: 10 mg via SUBCUTANEOUS
  Filled 2012-08-05: qty 0.5

## 2012-08-05 MED ORDER — OXYCODONE-ACETAMINOPHEN 5-325 MG PO TABS
1.0000 | ORAL_TABLET | ORAL | Status: DC | PRN
  Administered 2012-08-06: 1 via ORAL
  Administered 2012-08-06: 2 via ORAL
  Administered 2012-08-06 (×3): 1 via ORAL
  Administered 2012-08-07 – 2012-08-08 (×7): 2 via ORAL
  Filled 2012-08-05: qty 2
  Filled 2012-08-05: qty 1
  Filled 2012-08-05 (×2): qty 2
  Filled 2012-08-05 (×2): qty 1
  Filled 2012-08-05: qty 2
  Filled 2012-08-05: qty 1
  Filled 2012-08-05 (×4): qty 2

## 2012-08-05 MED ORDER — MIDAZOLAM HCL 2 MG/2ML IJ SOLN: 0.5000 mg | Freq: Once | INTRAMUSCULAR | Status: DC | PRN

## 2012-08-05 MED ORDER — EPHEDRINE SULFATE 50 MG/ML IJ SOLN
INTRAMUSCULAR | Status: DC | PRN
  Administered 2012-08-05: 10 mg via INTRAVENOUS

## 2012-08-05 MED ORDER — FENTANYL CITRATE 0.05 MG/ML IJ SOLN
25.0000 ug | INTRAMUSCULAR | Status: DC | PRN
  Administered 2012-08-05 (×2): 50 ug via INTRAVENOUS

## 2012-08-05 MED ORDER — BUPIVACAINE IN DEXTROSE 0.75-8.25 % IT SOLN
INTRATHECAL | Status: DC | PRN
  Administered 2012-08-05: 1.4 mg via INTRATHECAL

## 2012-08-05 MED ORDER — IBUPROFEN 600 MG PO TABS
600.0000 mg | ORAL_TABLET | Freq: Four times a day (QID) | ORAL | Status: DC
  Administered 2012-08-05 – 2012-08-08 (×11): 600 mg via ORAL
  Filled 2012-08-05 (×11): qty 1

## 2012-08-05 MED ORDER — FENTANYL CITRATE 0.05 MG/ML IJ SOLN
INTRAMUSCULAR | Status: DC | PRN
  Administered 2012-08-05: 75 ug via INTRAVENOUS

## 2012-08-05 MED ORDER — OXYTOCIN 10 UNIT/ML IJ SOLN
INTRAMUSCULAR | Status: AC
  Filled 2012-08-05: qty 4

## 2012-08-05 MED ORDER — ONDANSETRON HCL 4 MG/2ML IJ SOLN: 4.0000 mg | Freq: Three times a day (TID) | INTRAMUSCULAR | Status: DC | PRN

## 2012-08-05 MED ORDER — GENTAMICIN SULFATE 40 MG/ML IJ SOLN
INTRAVENOUS | Status: DC
  Filled 2012-08-05: qty 12.7

## 2012-08-05 MED ORDER — PRENATAL MULTIVITAMIN CH
1.0000 | ORAL_TABLET | Freq: Every day | ORAL | Status: DC
  Administered 2012-08-06 – 2012-08-07 (×2): 1 via ORAL
  Filled 2012-08-05: qty 1

## 2012-08-05 MED ORDER — KETOROLAC TROMETHAMINE 30 MG/ML IJ SOLN: 30.0000 mg | Freq: Four times a day (QID) | INTRAMUSCULAR | Status: AC | PRN

## 2012-08-05 MED ORDER — ONDANSETRON HCL 4 MG/2ML IJ SOLN
INTRAMUSCULAR | Status: DC | PRN
  Administered 2012-08-05: 4 mg via INTRAVENOUS

## 2012-08-05 MED ORDER — FENTANYL CITRATE 0.05 MG/ML IJ SOLN
INTRAMUSCULAR | Status: AC
  Administered 2012-08-05: 50 ug via INTRAVENOUS
  Filled 2012-08-05: qty 2

## 2012-08-05 MED ORDER — DIBUCAINE 1 % RE OINT: 1.0000 "application " | TOPICAL_OINTMENT | RECTAL | Status: DC | PRN

## 2012-08-05 MED ORDER — DIPHENHYDRAMINE HCL 25 MG PO CAPS
25.0000 mg | ORAL_CAPSULE | Freq: Four times a day (QID) | ORAL | Status: DC | PRN
  Filled 2012-08-05 (×2): qty 1

## 2012-08-05 MED ORDER — FENTANYL CITRATE 0.05 MG/ML IJ SOLN
INTRAMUSCULAR | Status: AC
  Filled 2012-08-05: qty 2

## 2012-08-05 MED ORDER — DIPHENHYDRAMINE HCL 25 MG PO CAPS
25.0000 mg | ORAL_CAPSULE | ORAL | Status: DC | PRN
  Administered 2012-08-05 – 2012-08-07 (×4): 25 mg via ORAL
  Filled 2012-08-05 (×2): qty 1

## 2012-08-05 MED ORDER — METOCLOPRAMIDE HCL 5 MG/ML IJ SOLN: 10.0000 mg | Freq: Three times a day (TID) | INTRAMUSCULAR | Status: DC | PRN

## 2012-08-05 MED ORDER — SODIUM CHLORIDE 0.9 % IV SOLN: 1.0000 ug/kg/h | INTRAVENOUS | Status: DC | PRN

## 2012-08-05 MED ORDER — MENTHOL 3 MG MT LOZG: 1.0000 | LOZENGE | OROMUCOSAL | Status: DC | PRN

## 2012-08-05 MED ORDER — PROMETHAZINE HCL 25 MG/ML IJ SOLN: 6.2500 mg | INTRAMUSCULAR | Status: DC | PRN

## 2012-08-05 MED ORDER — ONDANSETRON HCL 4 MG/2ML IJ SOLN
INTRAMUSCULAR | Status: AC
  Filled 2012-08-05: qty 2

## 2012-08-05 MED ORDER — SIMETHICONE 80 MG PO CHEW
80.0000 mg | CHEWABLE_TABLET | Freq: Three times a day (TID) | ORAL | Status: DC
  Administered 2012-08-06 – 2012-08-08 (×6): 80 mg via ORAL

## 2012-08-05 MED ORDER — PHENYLEPHRINE HCL 10 MG/ML IJ SOLN
INTRAMUSCULAR | Status: DC | PRN
  Administered 2012-08-05 (×6): 80 ug via INTRAVENOUS

## 2012-08-05 SURGICAL SUPPLY — 42 items
CLOTH BEACON ORANGE TIMEOUT ST (SAFETY) ×2 IMPLANT
CONTAINER PREFILL 10% NBF 15ML (MISCELLANEOUS) IMPLANT
DRAIN JACKSON PRT FLT 7MM (DRAIN) IMPLANT
DRAPE SURG 17X23 STRL (DRAPES) ×2 IMPLANT
DRESSING TELFA 8X3 (GAUZE/BANDAGES/DRESSINGS) ×2 IMPLANT
DRSG COVADERM 4X10 (GAUZE/BANDAGES/DRESSINGS) ×1 IMPLANT
DURAPREP 26ML APPLICATOR (WOUND CARE) ×2 IMPLANT
ELECT REM PT RETURN 9FT ADLT (ELECTROSURGICAL) ×2
ELECTRODE REM PT RTRN 9FT ADLT (ELECTROSURGICAL) ×1 IMPLANT
EVACUATOR SILICONE 100CC (DRAIN) IMPLANT
EXTRACTOR VACUUM M CUP 4 TUBE (SUCTIONS) IMPLANT
GAUZE SPONGE 4X4 12PLY STRL LF (GAUZE/BANDAGES/DRESSINGS) ×3 IMPLANT
GLOVE BIOGEL PI IND STRL 8.5 (GLOVE) ×1 IMPLANT
GLOVE BIOGEL PI INDICATOR 8.5 (GLOVE) ×1
GLOVE ECLIPSE 8.0 STRL XLNG CF (GLOVE) ×4 IMPLANT
GOWN PREVENTION PLUS LG XLONG (DISPOSABLE) ×4 IMPLANT
GOWN PREVENTION PLUS XXLARGE (GOWN DISPOSABLE) ×2 IMPLANT
KIT ABG SYR 3ML LUER SLIP (SYRINGE) IMPLANT
NDL HYPO 25X1 1.5 SAFETY (NEEDLE) ×1 IMPLANT
NDL HYPO 25X5/8 SAFETYGLIDE (NEEDLE) IMPLANT
NEEDLE HYPO 25X1 1.5 SAFETY (NEEDLE) ×2 IMPLANT
NEEDLE HYPO 25X5/8 SAFETYGLIDE (NEEDLE) IMPLANT
PACK C SECTION WH (CUSTOM PROCEDURE TRAY) ×2 IMPLANT
PAD ABD 7.5X8 STRL (GAUZE/BANDAGES/DRESSINGS) ×2 IMPLANT
PAD OB MATERNITY 4.3X12.25 (PERSONAL CARE ITEMS) ×1 IMPLANT
RINGERS IRRIG 1000ML POUR BTL (IV SOLUTION) ×2 IMPLANT
SLEEVE SCD COMPRESS KNEE MED (MISCELLANEOUS) ×1 IMPLANT
STAPLER VISISTAT 35W (STAPLE) IMPLANT
SUT MNCRL AB 3-0 PS2 27 (SUTURE) IMPLANT
SUT PLAIN 0 NONE (SUTURE) IMPLANT
SUT SILK 3 0 FS 1X18 (SUTURE) IMPLANT
SUT VIC AB 0 CT1 27 (SUTURE) ×4
SUT VIC AB 0 CT1 27XBRD ANBCTR (SUTURE) ×2 IMPLANT
SUT VIC AB 2-0 CTX 36 (SUTURE) ×4 IMPLANT
SUT VIC AB 3-0 CT1 27 (SUTURE)
SUT VIC AB 3-0 CT1 TAPERPNT 27 (SUTURE) IMPLANT
SUT VIC AB 3-0 SH 27 (SUTURE)
SUT VIC AB 3-0 SH 27X BRD (SUTURE) IMPLANT
SYR CONTROL 10ML LL (SYRINGE) ×2 IMPLANT
TOWEL OR 17X24 6PK STRL BLUE (TOWEL DISPOSABLE) ×4 IMPLANT
TRAY FOLEY CATH 14FR (SET/KITS/TRAYS/PACK) ×2 IMPLANT
WATER STERILE IRR 1000ML POUR (IV SOLUTION) ×1 IMPLANT

## 2012-08-05 NOTE — Telephone Encounter (Signed)
Patient called about upcoming surgery.  AVS

## 2012-08-05 NOTE — Anesthesia Postprocedure Evaluation (Signed)
Anesthesia Post Note  Patient: [name]  Procedure(s) Performed: Procedure(s) (LRB): CESAREAN SECTION (N/A)  Anesthesia type: Spinal  Patient location: PACU  Post pain: Pain level controlled  Post assessment: Post-op Vital signs reviewed  Last Vitals:  Filed Vitals:   08/05/12 1245  BP: 107/67  Pulse: 79  Temp:   Resp:     Post vital signs: Reviewed  Level of consciousness: awake  Complications: No apparent anesthesia complications

## 2012-08-05 NOTE — Anesthesia Procedure Notes (Signed)
Spinal  Patient location during procedure: OR Start time: 08/05/2012 11:20 AM End time: 08/05/2012 11:24 AM Staffing Anesthesiologist: Sandrea Hughs Performed by: anesthesiologist  Preanesthetic Checklist Completed: patient identified, site marked, surgical consent, pre-op evaluation, timeout performed, IV checked, risks and benefits discussed and monitors and equipment checked Spinal Block Patient position: sitting Prep: DuraPrep Patient monitoring: heart rate, cardiac monitor, continuous pulse ox and blood pressure Approach: midline Location: L3-4 Injection technique: single-shot Needle Needle type: Sprotte  Needle gauge: 24 G Needle length: 9 cm Needle insertion depth: 6 cm Assessment Sensory level: T4

## 2012-08-05 NOTE — Preoperative (Signed)
Beta Blockers   Reason not to administer Beta Blockers:Not Applicable 

## 2012-08-05 NOTE — H&P (Addendum)
The patient was interviewed and examined today.  The previously documented history and physical examination was reviewed. There are no changes. The operative procedure was reviewed. The risks and benefits were outlined again. The specific risks include, but are not limited to, anesthetic complications, bleeding, infections, and possible damage to the surrounding organs. The patient's questions were answered.  We are ready to proceed as outlined. The likelihood of the patient achieving the goals of this procedure is very likely. The patient is not completely committed to tubal ligation. Long-term contraception was discussed.  BP 125/76  Pulse 97  Temp 98.2 F (36.8 C) (Oral)  Resp 18  Ht 5\' 4"  (1.626 m)  Wt 222 lb (100.699 kg)  BMI 38.11 kg/m2  SpO2 100%  LMP 11/06/2011  CBC    Component Value Date/Time   WBC 8.6 08/05/2012 0914   RBC 3.76* 08/05/2012 0914   HGB 9.9* 08/05/2012 0914   HCT 30.2* 08/05/2012 0914   PLT 183 08/05/2012 0914   MCV 80.3 08/05/2012 0914   MCH 26.3 08/05/2012 0914   MCHC 32.8 08/05/2012 0914   RDW 14.5 08/05/2012 0914   LYMPHSABS 2.6 12/09/2011 2005   MONOABS 1.4* 12/09/2011 2005   EOSABS 0.3 12/09/2011 2005   BASOSABS 0.0 12/09/2011 2005    Leonard Schwartz, M.D.
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
## 2012-08-05 NOTE — Addendum Note (Signed)
Addendum  created 08/05/12 1759 by Renford Dills, CRNA   Modules edited:Notes Section

## 2012-08-05 NOTE — Op Note (Signed)
OPERATIVE NOTE  Patient's Name: [name]  Date of Birth: Oct 22, 1986   Medical Records Number: [id number]   Date of Operation: 08/05/2012   Preoperative diagnosis:  [redacted]w[redacted]d weeks gestation  Prior Cesarean  Desires Repeat Cesarean Section  Postoperative diagnosis:  [redacted]w[redacted]d weeks gestation  Prior cesarean section  Desires Repeat Cesarean Section  Procedure:  Repeat low transverse cesarean section  Surgeon:  Leonard Schwartz, M.D.  Assistant:  Nigel Bridgeman, certified nurse midwife  Anesthesia:  Regional  Disposition:  [name] is a 26 y.o. female, [redacted]w[redacted]d, who presents at [redacted]w[redacted]d weeks gestation. The patient has been followed at the [facility] obstetrics and gynecology division of [facility] health care for women. This pregnancy has been complicated by a prior cesarean section. The patient desires a repeat cesarean section. She understands the indications for her procedure and she accepts the risk of, but not limited to, anesthetic complications, bleeding, infections, and possible damage to the surrounding organs.  Findings:  A  female Redmond Baseman) was delivered from a occiput transverse position.  The Apgar scores were 8/9. The uterus, fallopian tubes, and ovaries were normal for the gravid state.  Procedure:  The patient was taken to the operating room where a spinal anesthetic was given.The perineum was prepped with betadine. A Foley catheter was placed in the bladder.The patient's abdomen was prepped with Duraprep.   The patient was sterilely draped. The lower abdomen was injected with half percent Marcaine with epinephrine. A low transverse incision was made in the abdomen and carried sharply through the subcutaneous tissue, the fascia, and the anterior peritoneum. An incision was made in the lower uterine segment. The incision was extended in a low transverse fashion. The membranes were ruptured. The fetal head was delivered without  difficulty. The mouth and nose were suctioned. The remainder of the infant was then delivered. The cord was clamped and cut. The infant was handed to the awaiting pediatric team. The placenta was removed. The uterine cavity was cleaned of amniotic fluid, clotted blood, and membranes. The uterine incision was closed using a running locking suture of 2-0 Vicryl. An imbricating suture of 2-0 Vicryl was placed. The pelvis was vigorously irrigated. Hemostasis was adequate. The anterior peritoneum and the abdominal musculature were closed using 2-0 Vicryl. The fascia was closed using a running suture of 0 Vicryl followed by 3 interrupted sutures of 0 Vicryl. The subcutaneous layer was closed using interrupted sutures of 2-0 Vicryl. The skin was reapproximated using a subcuticular suture of 3-0 Monocryl. Sponge, needle, and instrument counts were correct on 2 occasions. The estimated blood loss for the procedure was 800 cc. The patient tolerated her procedure well. She was transported to the recovery room in stable condition. The infant remained in the operating room with the mother for bonding. The placenta was sent to labor and delivery.  Leonard Schwartz, M.D.

## 2012-08-05 NOTE — Transfer of Care (Signed)
Immediate Anesthesia Transfer of Care Note  Patient: [name]  Procedure(s) Performed: Procedure(s) (LRB) with comments: CESAREAN SECTION (N/A) - Repeat  Patient Location: PACU  Anesthesia Type: Spinal  Level of Consciousness: awake, alert  and oriented  Airway & Oxygen Therapy: Patient Spontanous Breathing  Post-op Assessment: Report given to PACU RN and Post -op Vital signs reviewed and stable  Post vital signs: Reviewed  Complications: No apparent anesthesia complications

## 2012-08-05 NOTE — Telephone Encounter (Signed)
TC from pt returning Dr Debria Garret call, has no c/o. I called the pt back later after reviewing her chart and no answer, left message on VM, confirming pt was sched for c/s at 1045 tmrw, she is to arrive 2hr prior, nothing to eat or drink after midnight. If she had further questions to call main hospital number to connect with OR staff, or call [facility] office.

## 2012-08-05 NOTE — Anesthesia Postprocedure Evaluation (Signed)
  Anesthesia Post-op Note  Patient: [name]  Procedure(s) Performed: Procedure(s) (LRB) with comments: CESAREAN SECTION (N/A) - Repeat  Patient Location: Mother/Baby  Anesthesia Type: Spinal  Level of Consciousness: awake  Airway and Oxygen Therapy: Patient Spontanous Breathing  Post-op Pain: mild  Post-op Assessment: Patient's Cardiovascular Status Stable and Respiratory Function Stable  Post-op Vital Signs: stable  Complications: No apparent anesthesia complications

## 2012-08-06 ENCOUNTER — Encounter (HOSPITAL_COMMUNITY): Payer: Self-pay | Admitting: Obstetrics and Gynecology

## 2012-08-06 DIAGNOSIS — Z98891 History of uterine scar from previous surgery: Secondary | ICD-10-CM | POA: Diagnosis not present

## 2012-08-06 LAB — CBC
HCT: 26.4 % — ABNORMAL LOW (ref 36.0–46.0)
Hemoglobin: 8.5 g/dL — ABNORMAL LOW (ref 12.0–15.0)
RBC: 3.32 MIL/uL — ABNORMAL LOW (ref 3.87–5.11)
WBC: 8.9 10*3/uL (ref 4.0–10.5)

## 2012-08-06 LAB — CCBB MATERNAL DONOR DRAW

## 2012-08-06 MED ORDER — DOCUSATE SODIUM 100 MG PO CAPS
100.0000 mg | ORAL_CAPSULE | Freq: Two times a day (BID) | ORAL | Status: DC
  Administered 2012-08-06 – 2012-08-08 (×4): 100 mg via ORAL
  Filled 2012-08-06 (×4): qty 1

## 2012-08-06 MED ORDER — FERROUS SULFATE 325 (65 FE) MG PO TABS
325.0000 mg | ORAL_TABLET | Freq: Two times a day (BID) | ORAL | Status: DC
  Administered 2012-08-06 – 2012-08-08 (×4): 325 mg via ORAL
  Filled 2012-08-06 (×4): qty 1

## 2012-08-06 NOTE — Progress Notes (Signed)
Subjective: Postpartum Day 1: Cesarean Delivery secondary to:  Patient reports no problems voiding.  The patient is sleepy as had not slept well last night. Has been slow to ambulate and has been advised to ambulate with assistance today. The Patient will have orthostatic vital signs today to see if her low Hbg 8.4 will affect her ambulation.Tolerating regular diet. Has SCDs while in bed. Remains on pulse Ox and IV access with maintenance fluids remains at present. Will discontinue IV fluids but to have IV access remaining  Pain well controlled with po meds BF attempting at present. Mood stable, bonding well Contraception:   Objective: Vital signs in last 24 hours: Temp:  [97.5 F (36.4 C)-98.1 F (36.7 C)] 97.6 F (36.4 C) (10/11 0605) Pulse Rate:  [71-101] 72  (10/11 0605) Resp:  [15-21] 20  (10/11 0605) BP: (94-122)/(57-74) 94/57 mmHg (10/11 0605) SpO2:  [95 %-100 %] 98 % (10/11 0300)  Physical Exam:  General: alert, cooperative, appears stated age and fatigued Breasts: N/T Heart: RRR Lungs: CTAB Abdomen: BS x4 Uterine Fundus: firm Incision: healing well, no significant drainage, no dehiscence, no significant erythema, wound dressing to be removed later today at time of shower. To shower with assistance id orthostatic B/p's remain stable. Lochia: appropriate DVT Evaluation: No evidence of DVT seen on physical exam. Negative Homan's sign. No cords or calf tenderness. Calf/Ankle edema is present +1 bilaterally   Basename 08/06/12 0530 08/05/12 0914  HGB 8.5* 9.9*  HCT 26.4* 30.2*   Severe post partum anemia. Anemia present pre- op.  To start on Ferrous Sulfate 325 mgs po BID and Colace 100mg s po BID  Assessment/Plan: Status post Cesarean section. Doing well postoperatively.  Continue current care.  Lashya Passe, CNM. 08/06/2012, 9:52 AM

## 2012-08-06 NOTE — Progress Notes (Signed)
UR chart review completed.  

## 2012-08-07 NOTE — Progress Notes (Signed)
Patient ID: [name], female   DOB: 10-03-1986, 26 y.o.   MRN: [id number] Subjective: Postpartum Day 2: Cesarean Delivery secondary to: repeat hx 2 prior Patient reports tolerating PO, + flatus and no problems voiding, fatigue, hasn't slept much Sitting up eating breakfast now,  no complaints, up ad lib without syncope Pain well controlled with po meds BF well Mood stable, bonding well Contraception: Pt interested in nexplanon, R/B/S rv'd, pt doesn't want mirena,   Objective: Vital signs in last 24 hours: Temp:  [97.8 F (36.6 C)-98.2 F (36.8 C)] 98.2 F (36.8 C) (10/12 1440) Pulse Rate:  [76-81] 80  (10/12 1440) Resp:  [18-20] 18  (10/12 1440) BP: (98-118)/(63-78) 118/78 mmHg (10/12 1440)  Physical Exam:  General: alert, fatigued and no distress Heart: RRR Lungs: CTAB Abdomen: BS x4 Uterine Fundus: firm Incision: healing well, no significant drainage, no dehiscence JP drain:  Lochia: appropriate DVT Evaluation: No evidence of DVT seen on physical exam. Negative Homan's sign. No significant calf/ankle edema.   Basename 08/06/12 0530 08/05/12 0914  HGB 8.5* 9.9*  HCT 26.4* 30.2*    Assessment/Plan: Status post Cesarean section. Doing well postoperatively.  Anemia - slightly orthostatic yesterday, will repeat today Repeat CBC tmrw,  rv'd iron-rich foods, taking FE  Continue current care, plan dc tmrw   Evelena Masci M 08/07/2012, 8:04 PM

## 2012-08-08 ENCOUNTER — Encounter (HOSPITAL_COMMUNITY): Payer: Self-pay

## 2012-08-08 DIAGNOSIS — D649 Anemia, unspecified: Secondary | ICD-10-CM

## 2012-08-08 DIAGNOSIS — G4700 Insomnia, unspecified: Secondary | ICD-10-CM | POA: Clinically undetermined

## 2012-08-08 HISTORY — DX: Anemia, unspecified: D64.9

## 2012-08-08 LAB — CBC
HCT: 27.8 % — ABNORMAL LOW (ref 36.0–46.0)
MCV: 80.3 fL (ref 78.0–100.0)
RBC: 3.46 MIL/uL — ABNORMAL LOW (ref 3.87–5.11)
RDW: 14.6 % (ref 11.5–15.5)
WBC: 8.2 10*3/uL (ref 4.0–10.5)

## 2012-08-08 MED ORDER — OXYCODONE-ACETAMINOPHEN 5-325 MG PO TABS: 1.0000 | ORAL_TABLET | ORAL | Status: DC | PRN

## 2012-08-08 MED ORDER — ZOLPIDEM TARTRATE 5 MG PO TABS: 5.0000 mg | ORAL_TABLET | Freq: Every evening | ORAL | Status: DC | PRN

## 2012-08-08 MED ORDER — IBUPROFEN 600 MG PO TABS: 600.0000 mg | ORAL_TABLET | Freq: Four times a day (QID) | ORAL | Status: DC | PRN

## 2012-08-08 MED ORDER — FERROUS SULFATE 325 (65 FE) MG PO TABS: 325.0000 mg | ORAL_TABLET | Freq: Two times a day (BID) | ORAL | Status: DC

## 2012-08-08 MED ORDER — DSS 100 MG PO CAPS: 100.0000 mg | ORAL_CAPSULE | Freq: Two times a day (BID) | ORAL | Status: DC

## 2012-08-08 MED ORDER — BISACODYL 10 MG RE SUPP
10.0000 mg | Freq: Once | RECTAL | Status: DC
  Filled 2012-08-08: qty 1

## 2012-08-09 ENCOUNTER — Telehealth: Payer: Self-pay | Admitting: Obstetrics and Gynecology

## 2012-08-09 LAB — TYPE AND SCREEN
ABO/RH(D): O POS
Antibody Screen: NEGATIVE
Unit division: 0
Unit division: 0

## 2012-08-09 NOTE — Telephone Encounter (Signed)
Spoke with pt rgd msg pt c/o back pain in her shoulder blade since delivery no relief with percocet pt states its a pulling sensation offered pt an appt foe eval pt has appt 08/10/12 at 9:30 with AVS pt voice understanding

## 2012-08-10 ENCOUNTER — Telehealth: Payer: Self-pay | Admitting: Obstetrics and Gynecology

## 2012-08-10 ENCOUNTER — Encounter: Payer: Medicaid Other | Admitting: Obstetrics and Gynecology

## 2012-08-10 NOTE — Telephone Encounter (Signed)
TC to pt.  States forgot she had another appt and missed appt with Dr AVS today.  Offered appt 08/11/12.Pt agreeable. States is OK to wait until then.

## 2012-08-11 ENCOUNTER — Encounter: Payer: Self-pay | Admitting: Obstetrics and Gynecology

## 2012-08-11 ENCOUNTER — Ambulatory Visit (INDEPENDENT_AMBULATORY_CARE_PROVIDER_SITE_OTHER): Payer: Medicaid Other | Admitting: Obstetrics and Gynecology

## 2012-08-11 ENCOUNTER — Telehealth: Payer: Self-pay | Admitting: Obstetrics and Gynecology

## 2012-08-11 VITALS — BP 116/80 | Resp 18 | Wt 208.0 lb

## 2012-08-11 DIAGNOSIS — M549 Dorsalgia, unspecified: Secondary | ICD-10-CM

## 2012-08-11 DIAGNOSIS — IMO0002 Reserved for concepts with insufficient information to code with codable children: Secondary | ICD-10-CM

## 2012-08-11 DIAGNOSIS — S46919A Strain of unspecified muscle, fascia and tendon at shoulder and upper arm level, unspecified arm, initial encounter: Secondary | ICD-10-CM

## 2012-08-11 MED ORDER — IBUPROFEN 800 MG PO TABS: 800.0000 mg | ORAL_TABLET | Freq: Three times a day (TID) | ORAL | Status: DC | PRN

## 2012-08-11 MED ORDER — CYCLOBENZAPRINE HCL 10 MG PO TABS: 10.0000 mg | ORAL_TABLET | Freq: Three times a day (TID) | ORAL | Status: AC | PRN

## 2012-08-11 NOTE — Progress Notes (Signed)
HISTORY OF PRESENT ILLNESS  Ms. Tearsa J Schechter-Fultz is a 26 y.o. year old female,[id number], who presents for a problem visit. The patient had a cesarean section on August 05, 2012.  She did well.  Subjective:  The patient reports that since her surgery she has had a ripping pain in her right upper back.  The pain is worse with motion particularly on the right side.  Objective:  BP 116/80  Resp 18  Wt 208 lb (94.348 kg)  LMP 11/06/2011  Breastfeeding? Yes   General: alert Resp: clear to auscultation bilaterally Cardio: regular rate and rhythm, S1, S2 normal, no murmur, click, rub or gallop GI: soft and nontender.  Incision is clean. Back: Lower back the spine, nontender.  Upper back is tender to examine the right upper quadrant.  Pain is reproduced with muscle tension and contraction. Exam deferred.  Assessment:  Upper back pain Muscle strain under the right scapula  Plan:  Motrin 800 mg every 8 hours as needed Flexeril 10 mg every 8 hours as needed  Return to office in 4 week(s).   Leonard Schwartz M.D.  08/11/2012 10:23 AM

## 2012-08-11 NOTE — Telephone Encounter (Signed)
TC from pt. S/P C/S 08/05/12  States bleeding had slowed down but had sudden gush and now is "dripping" as she sits on toilet.  Saw Dr AVS this AM.  Has been out and active since then.  Informed may have increased activity too soon. Instructed to put on  pad, rest, increase fluids.   To call if bleeding >pad/hr, no improvement or any concerns.  Pt is agreeable and verbalizes comprehension.

## 2012-08-18 ENCOUNTER — Telehealth: Payer: Self-pay | Admitting: Obstetrics and Gynecology

## 2012-08-18 MED ORDER — FLUCONAZOLE 150 MG PO TABS: ORAL_TABLET | ORAL | Status: DC

## 2012-08-18 MED ORDER — NYSTATIN 100000 UNIT/GM EX CREA: TOPICAL_CREAM | Freq: Two times a day (BID) | CUTANEOUS | Status: DC

## 2012-08-18 NOTE — Telephone Encounter (Signed)
Spoke with pt rgd msg pt states baby with thrush wants rx per DEE CNM pt can have rx for nystatin and diflucan informed pt rx sent to pharm pt voice understanding

## 2012-09-09 NOTE — Telephone Encounter (Signed)
Note to close encounter. [name], [name]  

## 2012-09-11 ENCOUNTER — Inpatient Hospital Stay (HOSPITAL_COMMUNITY)
Admission: AD | Admit: 2012-09-11 | Discharge: 2012-09-11 | Disposition: A | Discharge status: Home / Self Care | Payer: Medicaid Other | Source: Ambulatory Visit | Attending: Obstetrics and Gynecology | Admitting: Obstetrics and Gynecology

## 2012-09-11 ENCOUNTER — Telehealth: Payer: Self-pay | Admitting: Obstetrics and Gynecology

## 2012-09-11 ENCOUNTER — Encounter (HOSPITAL_COMMUNITY): Payer: Self-pay | Admitting: Obstetrics and Gynecology

## 2012-09-11 DIAGNOSIS — B373 Candidiasis of vulva and vagina: Secondary | ICD-10-CM | POA: Diagnosis present

## 2012-09-11 DIAGNOSIS — B9689 Other specified bacterial agents as the cause of diseases classified elsewhere: Secondary | ICD-10-CM | POA: Diagnosis present

## 2012-09-11 DIAGNOSIS — A499 Bacterial infection, unspecified: Secondary | ICD-10-CM | POA: Insufficient documentation

## 2012-09-11 DIAGNOSIS — O2390 Unspecified genitourinary tract infection in pregnancy, unspecified trimester: Secondary | ICD-10-CM | POA: Insufficient documentation

## 2012-09-11 DIAGNOSIS — J111 Influenza due to unidentified influenza virus with other respiratory manifestations: Secondary | ICD-10-CM

## 2012-09-11 DIAGNOSIS — N760 Acute vaginitis: Secondary | ICD-10-CM

## 2012-09-11 DIAGNOSIS — N898 Other specified noninflammatory disorders of vagina: Secondary | ICD-10-CM

## 2012-09-11 DIAGNOSIS — R6889 Other general symptoms and signs: Secondary | ICD-10-CM | POA: Diagnosis present

## 2012-09-11 DIAGNOSIS — N949 Unspecified condition associated with female genital organs and menstrual cycle: Secondary | ICD-10-CM | POA: Insufficient documentation

## 2012-09-11 LAB — WET PREP, GENITAL: Clue Cells Wet Prep HPF POC: NONE SEEN

## 2012-09-11 MED ORDER — OSELTAMIVIR PHOSPHATE 75 MG PO CAPS
75.0000 mg | ORAL_CAPSULE | Freq: Once | ORAL | Status: AC
  Administered 2012-09-11: 75 mg via ORAL
  Filled 2012-09-11: qty 1

## 2012-09-11 MED ORDER — FLUCONAZOLE 150 MG PO TABS: 150.0000 mg | ORAL_TABLET | Freq: Once | ORAL | Status: DC

## 2012-09-11 MED ORDER — OSELTAMIVIR PHOSPHATE 75 MG PO CAPS: 75.0000 mg | ORAL_CAPSULE | Freq: Two times a day (BID) | ORAL | Status: DC

## 2012-09-11 MED ORDER — METRONIDAZOLE 0.75 % VA GEL: 1.0000 | Freq: Every day | VAGINAL | Status: AC

## 2012-09-11 NOTE — MAU Note (Signed)
C/s on 10/10.  About 3 days ago noted a vaginal odor.[name]  Noted a yellow mucous d/c. D/c is darker today, odor still present.   This was a scheduled repeat c/s.  Had a fever last night 102.7.

## 2012-09-11 NOTE — MAU Note (Signed)
Fever last night, sore throat started same day as noted odor.  No GI or GU complaints.  No cough, but has sore throat.  Pt is breast feeding.
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
## 2012-09-11 NOTE — MAU Note (Signed)
Isolation explained to pt, asked to explain to family in lobby.

## 2012-09-11 NOTE — MAU Note (Signed)
"  I have a horrible smelling vaginal d/c.  It's yellow and mucousy.  I thought maybe I wasn't cleaning myself very well.  I am having incisional pain for about 3 days - randomly sharp pains that occur at different times.  No drainage from the incision. "

## 2012-09-11 NOTE — Telephone Encounter (Signed)
TC from patient--5 weeks postop C/S, now with yellow, foul smelling d/c. No significant pain.  Bleeding stopped last week. No fever.  Come to MAU.

## 2012-09-13 ENCOUNTER — Telehealth: Payer: Self-pay

## 2012-09-13 NOTE — Telephone Encounter (Signed)
Message copied by Janeece Agee on Mon Sep 13, 2012  8:39 AM ------      Message from: Cornelius Moras      Created: Sat Sep 11, 2012  7:26 PM      Regarding: postpartum C/S appt       Needs post-op C/S appt and [place] that day to ensure no retained POC, if still bleeding then.        Please call patient and schedule.this visit.            VL

## 2012-09-13 NOTE — Telephone Encounter (Signed)
LM for pt to c/b regarding PP c/s appt.

## 2012-09-14 ENCOUNTER — Telehealth: Payer: Self-pay

## 2012-09-14 NOTE — Telephone Encounter (Signed)
Spoke with pt informing her post op c/s is needed per VL. Pt states she is longer bleeding. Appt scheduled 09/21/12 with AVS at 1:45 pm. Pt also informed if she has any other concerns before this appt, give the office a call. Pt agrees and voices understanding.

## 2012-09-14 NOTE — Telephone Encounter (Signed)
Message copied by Janeece Agee on Tue Sep 14, 2012  9:25 AM ------      Message from: Cornelius Moras      Created: Sat Sep 11, 2012  7:26 PM      Regarding: postpartum C/S appt       Needs post-op C/S appt and [place] that day to ensure no retained POC, if still bleeding then.        Please call patient and schedule.this visit.            VL

## 2012-09-14 NOTE — Telephone Encounter (Signed)
LM for pt to cb 

## 2012-09-21 ENCOUNTER — Encounter: Payer: Medicaid Other | Admitting: Obstetrics and Gynecology

## 2012-10-06 ENCOUNTER — Encounter: Payer: Medicaid Other | Admitting: Obstetrics and Gynecology

## 2012-10-28 ENCOUNTER — Encounter: Payer: Medicaid Other | Admitting: Obstetrics and Gynecology

## 2012-11-02 ENCOUNTER — Telehealth: Payer: Self-pay | Admitting: Obstetrics and Gynecology

## 2012-11-03 ENCOUNTER — Encounter: Payer: Medicaid Other | Admitting: Obstetrics and Gynecology

## 2012-11-30 ENCOUNTER — Encounter: Payer: Self-pay | Admitting: Obstetrics and Gynecology

## 2012-11-30 ENCOUNTER — Ambulatory Visit: Payer: Medicaid Other | Admitting: Obstetrics and Gynecology

## 2012-11-30 VITALS — BP 110/74 | Ht 64.0 in | Wt 214.0 lb

## 2012-11-30 DIAGNOSIS — IMO0001 Reserved for inherently not codable concepts without codable children: Secondary | ICD-10-CM

## 2012-11-30 DIAGNOSIS — N912 Amenorrhea, unspecified: Secondary | ICD-10-CM

## 2012-11-30 MED ORDER — NORETHINDRONE 0.35 MG PO TABS: 1.0000 | ORAL_TABLET | Freq: Every day | ORAL | Status: DC

## 2012-11-30 NOTE — Addendum Note (Signed)
Addended by: Tim Lair on: 11/30/2012 10:29 AM   Modules accepted: Orders

## 2012-11-30 NOTE — Progress Notes (Signed)
HISTORY OF PRESENT ILLNESS  [name] is a 27 y.o. year old female,G6P3215, who presents for a problem visit. The patient had a cesarean section on August 05, 2012 where she delivered a female infant named [name]. She did not return for her 6 weeks postpartum visit.  Subjective:  The patient is sexually active.  She is not using contraception.  She has 5 children at home. She has some stress urinary incontinence.  Objective:  BP 110/74  Ht 5\' 4"  (1.626 m)  Wt 214 lb (97.07 kg)  BMI 36.73 kg/m2  LMP 11/06/2011  Breastfeeding? No   General: no distress GI: soft and nontender  External genitalia: normal general appearance Vaginal: normal without tenderness, induration or masses and relaxation noted Cervix: normal appearance Adnexa: normal bimanual exam Uterus: upper limits normal size  Urine pregnancy test: negative  Assessment:  Amenorrhea  Contraception  Noncompliance  Pelvic relaxation with incontinence  Plan:  The patient continues to breast-feed.  Micronor for contraception.  Kegel exercises recommended.  E PDS: 2  Return to office in 4 month(s) for annual exam.   Leonard Schwartz M.D.  11/30/2012 10:13 AM    Date of delivery: 08/05/2012 Female Name: [name] Vaginal delivery:no Cesarean section:yes Tubal ligation:no GDM:no Breast Feeding:yes Bottle Feeding:yes Post-Partum Blues:No Abnormal pap:no Normal GU function: yes Normal GI function:yes Returning to work:yes EPDS: 2

## 2012-11-30 NOTE — Addendum Note (Signed)
Addended by: Tim Lair on: 11/30/2012 02:06 PM   Modules accepted: Orders

## 2013-09-13 ENCOUNTER — Encounter (HOSPITAL_COMMUNITY): Payer: Self-pay | Admitting: *Deleted

## 2013-09-13 ENCOUNTER — Inpatient Hospital Stay (HOSPITAL_COMMUNITY)
Admission: AD | Admit: 2013-09-13 | Discharge: 2013-09-13 | Disposition: A | Discharge status: Home / Self Care | Payer: Medicaid Other | Source: Ambulatory Visit | Attending: Obstetrics and Gynecology | Admitting: Obstetrics and Gynecology

## 2013-09-13 ENCOUNTER — Inpatient Hospital Stay (HOSPITAL_COMMUNITY): Payer: Medicaid Other

## 2013-09-13 DIAGNOSIS — N949 Unspecified condition associated with female genital organs and menstrual cycle: Secondary | ICD-10-CM | POA: Insufficient documentation

## 2013-09-13 DIAGNOSIS — A499 Bacterial infection, unspecified: Secondary | ICD-10-CM | POA: Insufficient documentation

## 2013-09-13 DIAGNOSIS — N760 Acute vaginitis: Secondary | ICD-10-CM | POA: Insufficient documentation

## 2013-09-13 DIAGNOSIS — B9689 Other specified bacterial agents as the cause of diseases classified elsewhere: Secondary | ICD-10-CM | POA: Insufficient documentation

## 2013-09-13 LAB — CBC
MCH: 27.8 pg (ref 26.0–34.0)
MCHC: 33.5 g/dL (ref 30.0–36.0)
Platelets: 307 10*3/uL (ref 150–400)
RDW: 13.1 % (ref 11.5–15.5)
WBC: 7.2 10*3/uL (ref 4.0–10.5)

## 2013-09-13 LAB — URINALYSIS, ROUTINE W REFLEX MICROSCOPIC
Leukocytes, UA: NEGATIVE
Nitrite: NEGATIVE
Protein, ur: NEGATIVE mg/dL
Urobilinogen, UA: 0.2 mg/dL (ref 0.0–1.0)

## 2013-09-13 LAB — WET PREP, GENITAL
Trich, Wet Prep: NONE SEEN
Yeast Wet Prep HPF POC: NONE SEEN

## 2013-09-13 LAB — POCT PREGNANCY, URINE: Preg Test, Ur: NEGATIVE

## 2013-09-13 MED ORDER — METRONIDAZOLE 500 MG PO TABS: 500.0000 mg | ORAL_TABLET | Freq: Two times a day (BID) | ORAL | Status: DC

## 2013-09-13 MED ORDER — IBUPROFEN 600 MG PO TABS: 600.0000 mg | ORAL_TABLET | Freq: Four times a day (QID) | ORAL | Status: DC | PRN

## 2013-09-13 NOTE — MAU Note (Signed)
Missed period, has done home test- it was neg. Had small amt of light pink bleeding last wk, lasted a couple hours. Pain in lower abd started about 10 days ago.

## 2013-09-13 NOTE — Progress Notes (Signed)
Wet prep results called to Dr Normand Sloop. Will put in D/C and send script for Flagyl for pt

## 2013-09-13 NOTE — Progress Notes (Signed)
Written and verbal d/c instructions given and understanding voiced. 

## 2013-09-13 NOTE — Progress Notes (Signed)
Dr Normand Sloop notified of normal lab and u/s results. Pt's pain currently 0. MD will see pt

## 2013-09-13 NOTE — MAU Provider Note (Signed)
Subjective: Interval History: has no complaint of intermittent pelvic pain . Pain is 8/10.  Laying still makes it better. Moving from side to side in a twisting motion makes it worse. No heavy lifting.  Pt had spotting a week ago.  She takes nothing for pain. No f/c/n/v  Objective: Vital signs in last 24 hours: Temp:  [97.9 F (36.6 C)-98.3 F (36.8 C)] 97.9 F (36.6 C) (11/18 1224) Pulse Rate:  [76-80] 76 (11/18 1224) Resp:  [18] 18 (11/18 1224) BP: (106-117)/(61-70) 106/61 mmHg (11/18 1224) SpO2:  [97 %] 97 % (11/18 0955) Weight:  [211 lb (95.709 kg)] 211 lb (95.709 kg) (11/18 0955)  Intake/Output from previous day:   Intake/Output this shift:    BP 106/61  Pulse 76  Temp(Src) 97.9 F (36.6 C) (Oral)  Resp 18  Ht 5\' 3"  (1.6 m)  Wt 211 lb (95.709 kg)  BMI 37.39 kg/m2  SpO2 97%  LMP 07/27/2013  Physical Examination: General appearance - alert, well appearing, and in no distress Chest - clear to auscultation, no wheezes, rales or rhonchi, symmetric air entry Heart - normal rate and regular rhythm Abdomen - soft, nontender, nondistended, no masses or organomegaly Pelvic - normal external genitalia, vulva, vagina, cervix, uterus and adnexa Extremities - peripheral pulses normal, no pedal edema, no clubbing or cyanosis   Results for orders placed during the hospital encounter of 09/13/13 (from the past 24 hour(s))  URINALYSIS, ROUTINE W REFLEX MICROSCOPIC     Status: None   Collection Time    09/13/13 10:00 AM      Result Value Range   Color, Urine YELLOW  YELLOW   APPearance CLEAR  CLEAR   Specific Gravity, Urine 1.020  1.005 - 1.030   pH 6.5  5.0 - 8.0   Glucose, UA NEGATIVE  NEGATIVE mg/dL   Hgb urine dipstick NEGATIVE  NEGATIVE   Bilirubin Urine NEGATIVE  NEGATIVE   Ketones, ur NEGATIVE  NEGATIVE mg/dL   Protein, ur NEGATIVE  NEGATIVE mg/dL   Urobilinogen, UA 0.2  0.0 - 1.0 mg/dL   Nitrite NEGATIVE  NEGATIVE   Leukocytes, UA NEGATIVE  NEGATIVE  POCT PREGNANCY,  URINE     Status: None   Collection Time    09/13/13 10:30 AM      Result Value Range   Preg Test, Ur NEGATIVE  NEGATIVE  CBC     Status: None   Collection Time    09/13/13 10:40 AM      Result Value Range   WBC 7.2  4.0 - 10.5 K/uL   RBC 4.49  3.87 - 5.11 MIL/uL   Hemoglobin 12.5  12.0 - 15.0 g/dL   HCT 16.1  09.6 - 04.5 %   MCV 83.1  78.0 - 100.0 fL   MCH 27.8  26.0 - 34.0 pg   MCHC 33.5  30.0 - 36.0 g/dL   RDW 40.9  81.1 - 91.4 %   Platelets 307  150 - 400 K/uL  WET PREP, GENITAL     Status: Abnormal   Collection Time    09/13/13 12:41 PM      Result Value Range   Yeast Wet Prep HPF POC NONE SEEN  NONE SEEN   Trich, Wet Prep NONE SEEN  NONE SEEN   Clue Cells Wet Prep HPF POC FEW (*) NONE SEEN   WBC, Wet Prep HPF POC FEW (*) NONE SEEN    Studies/Results: US Transvaginal Non-ob  09/13/2013   CLINICAL DATA:  PAIN, MISSED  PERIOD  EXAM: TRANSABDOMINAL AND TRANSVAGINAL ULTRASOUND OF PELVIS  TECHNIQUE: Both transabdominal and transvaginal ultrasound examinations of the pelvis were performed. Transabdominal technique was performed for global imaging of the pelvis including uterus, ovaries, adnexal regions, and pelvic cul-de-sac. It was necessary to proceed with endovaginal exam following the transabdominal exam to visualize the ovaries and endometrium.  COMPARISON:  None  FINDINGS: Uterus  Measurements: 7.5 x 4.6 x 6.2 cm. No fibroids or other mass visualized.  Endometrium  Thickness: 9.5 mm.  No focal abnormality visualized.  Right ovary  Measurements: 3.4 x 2.4 x 2.3 cm. Normal appearance/no adnexal mass. Multiple ovarian follicles noted.  Left ovary  Measurements: 3.9 x 1.8 x 2.5 cm. Normal appearance/no adnexal mass. Multiple ovarian follicles noted.  Other findings  No free fluid.  IMPRESSION: Normal pelvic ultrasound.   Electronically Signed   By: Elige Ko   On: 09/13/2013 11:53   US Pelvis Complete  09/13/2013   CLINICAL DATA:  PAIN, MISSED PERIOD  EXAM: TRANSABDOMINAL AND  TRANSVAGINAL ULTRASOUND OF PELVIS  TECHNIQUE: Both transabdominal and transvaginal ultrasound examinations of the pelvis were performed. Transabdominal technique was performed for global imaging of the pelvis including uterus, ovaries, adnexal regions, and pelvic cul-de-sac. It was necessary to proceed with endovaginal exam following the transabdominal exam to visualize the ovaries and endometrium.  COMPARISON:  None  FINDINGS: Uterus  Measurements: 7.5 x 4.6 x 6.2 cm. No fibroids or other mass visualized.  Endometrium  Thickness: 9.5 mm.  No focal abnormality visualized.  Right ovary  Measurements: 3.4 x 2.4 x 2.3 cm. Normal appearance/no adnexal mass. Multiple ovarian follicles noted.  Left ovary  Measurements: 3.9 x 1.8 x 2.5 cm. Normal appearance/no adnexal mass. Multiple ovarian follicles noted.  Other findings  No free fluid.  IMPRESSION: Normal pelvic ultrasound.   Electronically Signed   By: Elige Ko   On: 09/13/2013 11:53    Scheduled Meds: Continuous Infusions: PRN Meds:    Assessment/Plan: Pelvic pain BV Motrin and flagyl prn Come to the office if no menses in three months.      LOS: 0 days   Alwyn Cordner A

## 2014-08-07 IMAGING — US US FETAL BPP W/O NONSTRESS
1 series · 14 of 28 positions shown · non-contrast
Comparison: none

CLINICAL DATA: 33-week gestation with contractions, leaking
amniotic fluid.

[Series 1: us fetal bpp w/o nonstress · non-contrast · 29 acquisitions, 14 frames shown]
[im 2/29]
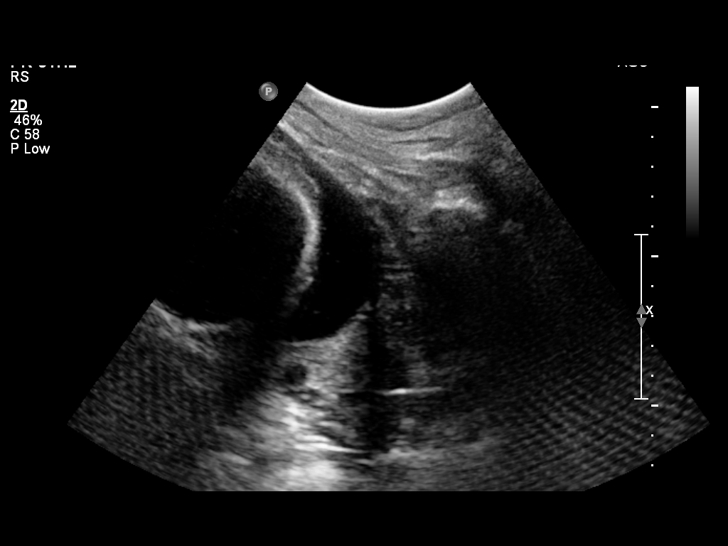
[im 4/29]
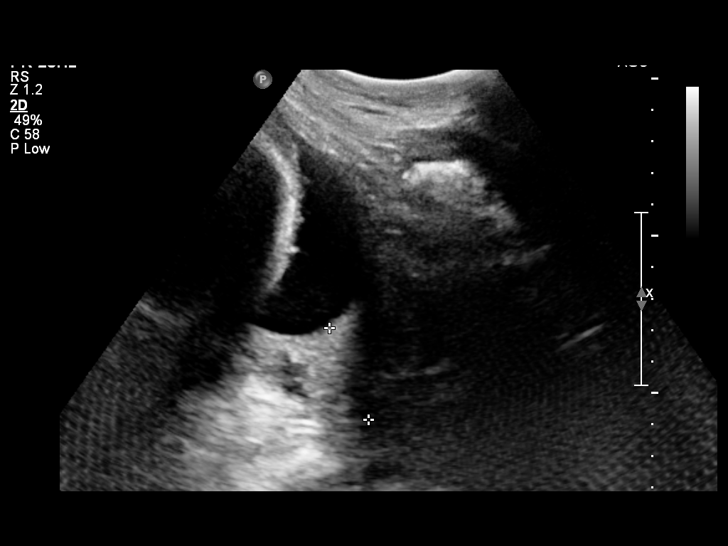
[im 6/29]
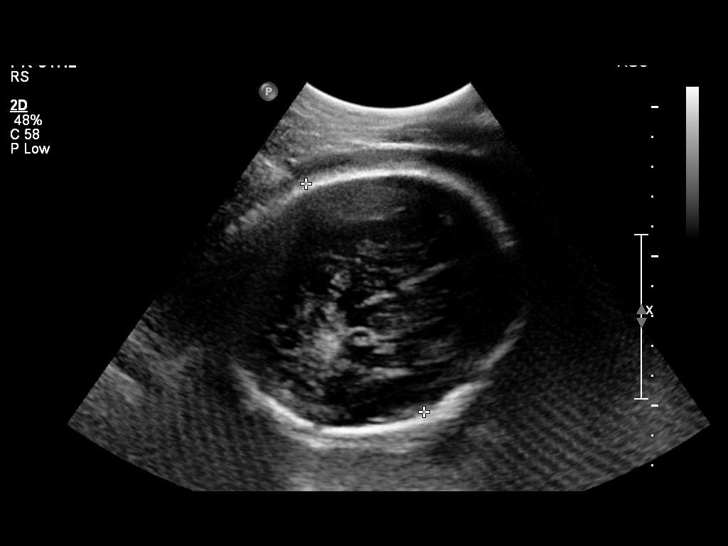
[im 8/29]
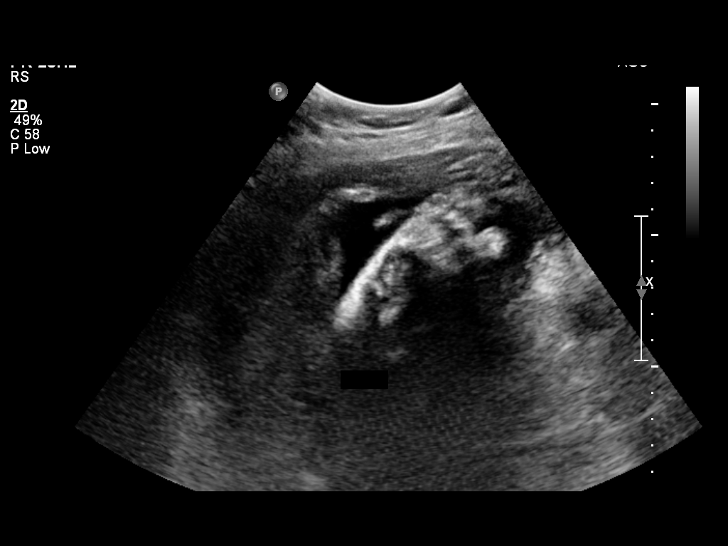
[im 10/29]
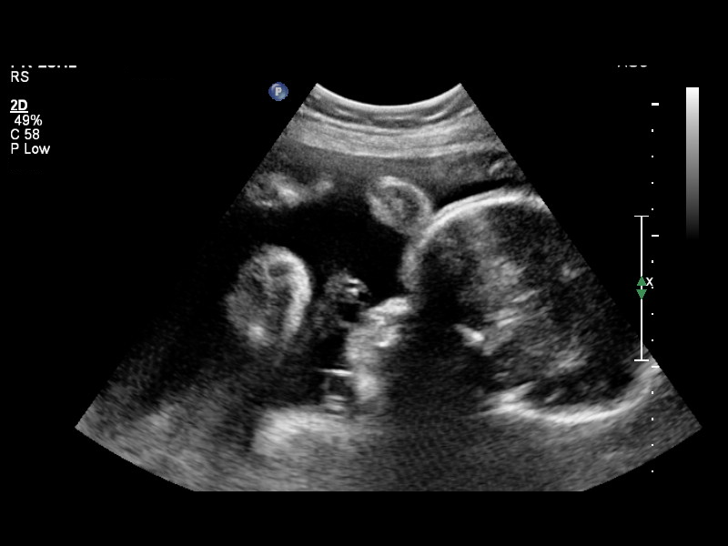
[im 12/29]
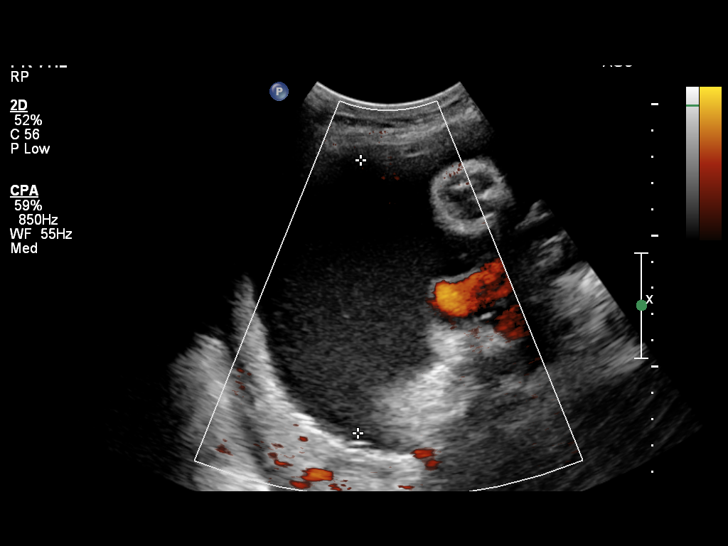
[im 14/29]
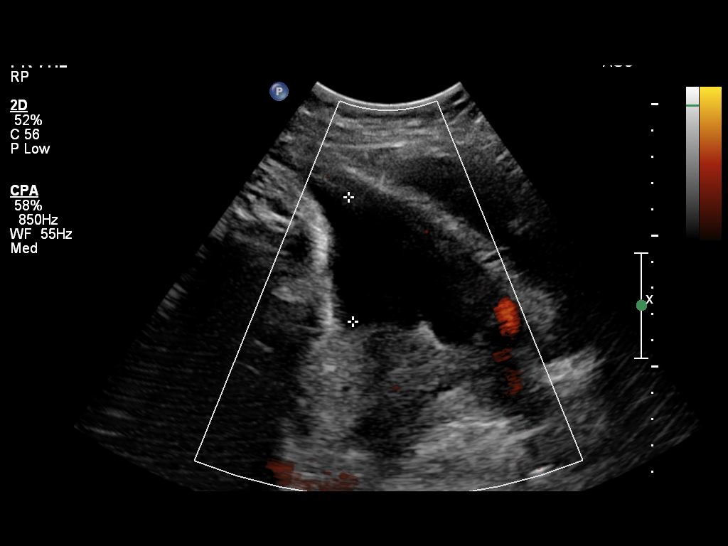
[im 16/29]
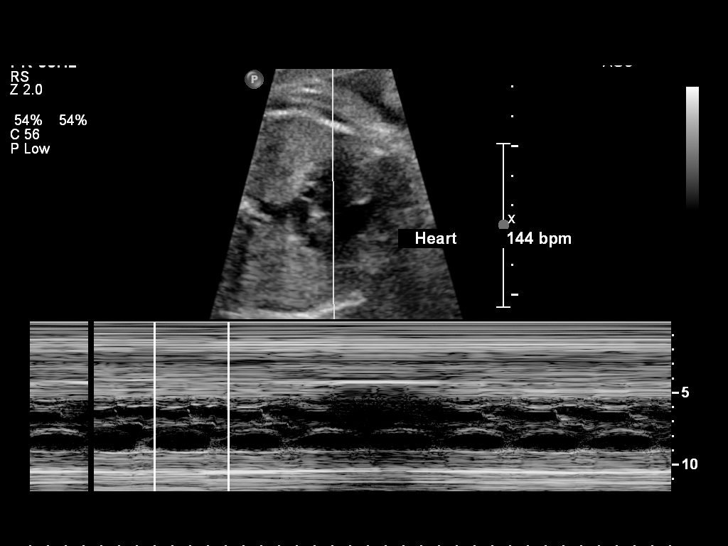
[im 18/29]
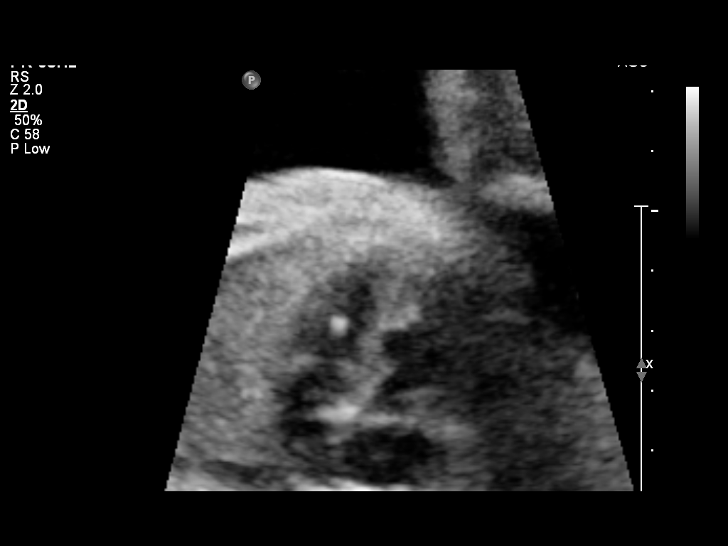
[im 20/29]
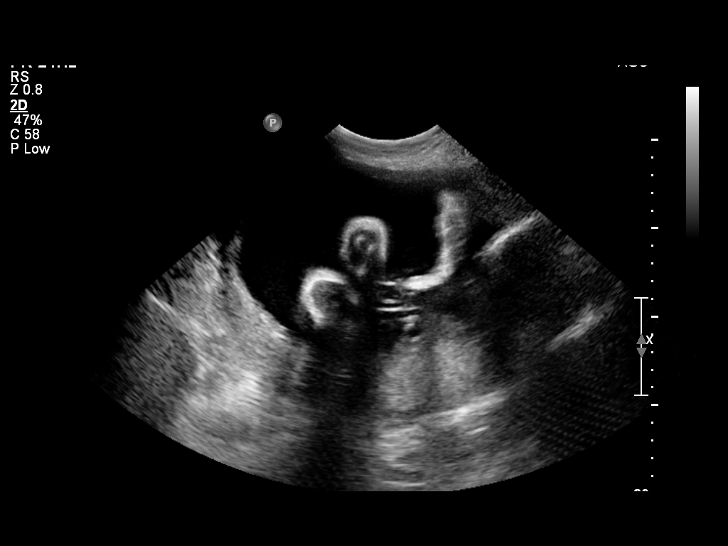
[im 22/29]
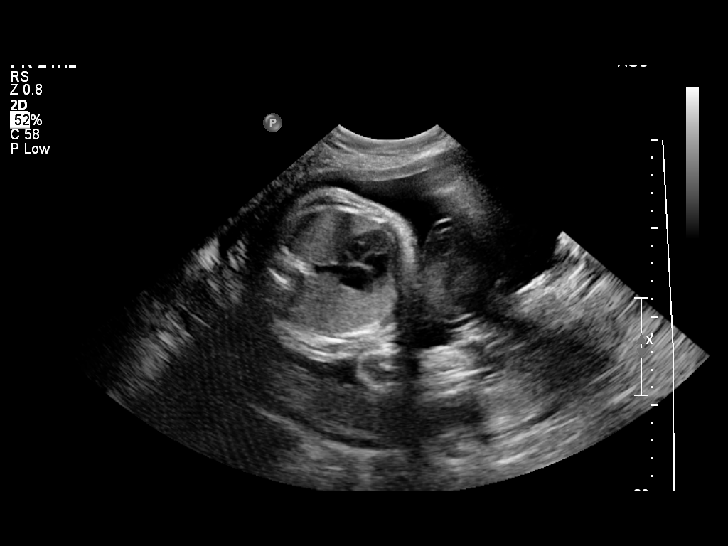
[im 24/29]
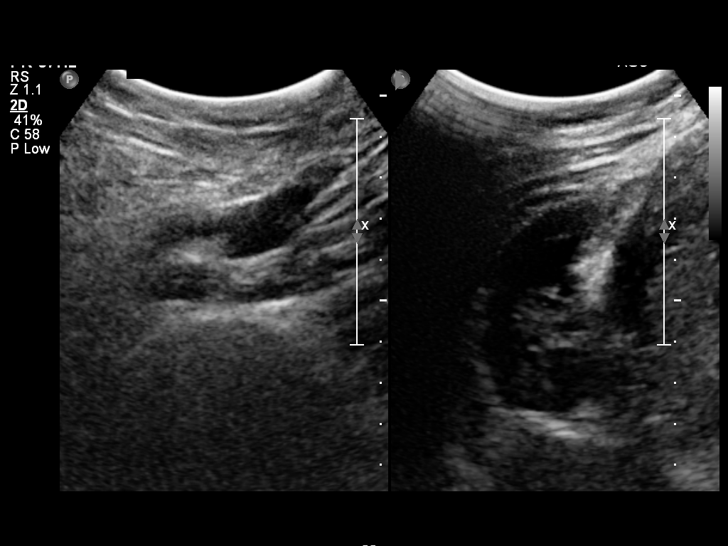
[im 26/29]
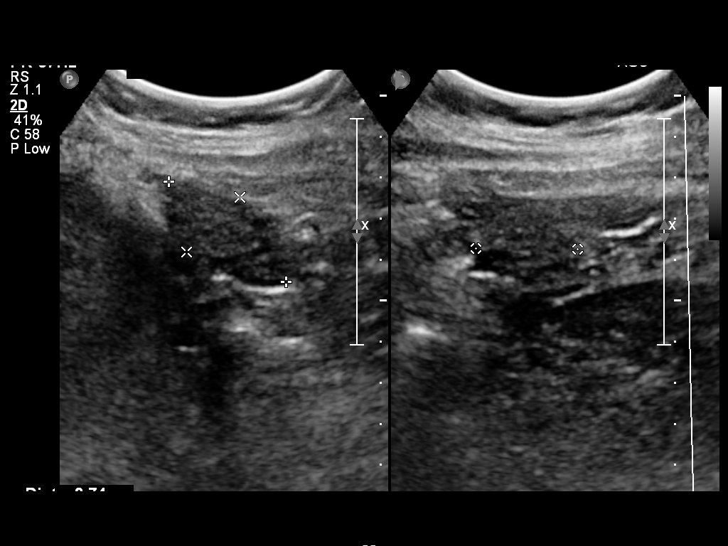
[im 29/29]
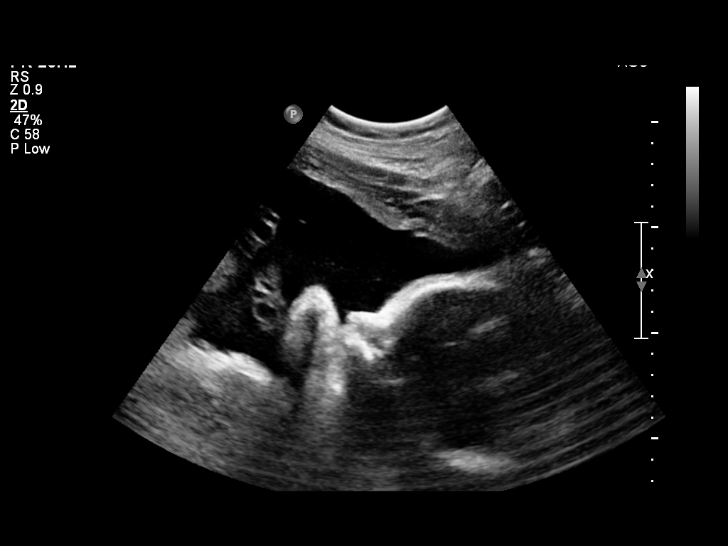

[14 of 28 positions shown; findings below may reference images not displayed]

LIMITED OBSTETRIC ULTRASOUND WITH BIOPHYSICAL PROFILE

Number of Fetuses: 1
Heart Rate: 144 bpm
Movement: Present.
Presentation: Cephalic.
Placental Location: Posterior.
Previa: Not present.
Amniotic Fluid (Subjective): Normal.

Vertical pocket:  10.4cm

BPD: 8.6cm   34w   4d   EDC: 08/03/2012

MATERNAL FINDINGS:
Cervix: Closed, measuring 3.2 cm in length.
Uterus/Adnexae: No abnormalities identified.

BIOPHYSICAL PROFILE

Movement: 2                   Time: 14 minutes
Breathing: 2
Tone: 2
Amniotic Fluid: 2

Total Score: [DATE]
IMPRESSION: Unremarkable limited obstetric ultrasound demonstrating no
abnormalities.  Biophysical profile is normal.  Estimated
gestational age by ultrasound is 34 weeks and 4 days.

Recommend followup with non-emergent complete OB 14+ wk US
examination for fetal biometric evaluation and anatomic survey if
not already performed.

## 2014-08-28 ENCOUNTER — Encounter (HOSPITAL_COMMUNITY): Payer: Self-pay | Admitting: *Deleted

## 2014-09-15 ENCOUNTER — Emergency Department (HOSPITAL_COMMUNITY): Payer: BC Managed Care – PPO

## 2014-09-15 ENCOUNTER — Encounter (HOSPITAL_COMMUNITY): Payer: Self-pay | Admitting: *Deleted

## 2014-09-15 ENCOUNTER — Emergency Department (HOSPITAL_COMMUNITY)
Admission: EM | Admit: 2014-09-15 | Discharge: 2014-09-15 | Disposition: A | Discharge status: Home / Self Care | Payer: BC Managed Care – PPO | Attending: Emergency Medicine | Admitting: Emergency Medicine

## 2014-09-15 DIAGNOSIS — Z87448 Personal history of other diseases of urinary system: Secondary | ICD-10-CM | POA: Insufficient documentation

## 2014-09-15 DIAGNOSIS — Z792 Long term (current) use of antibiotics: Secondary | ICD-10-CM | POA: Insufficient documentation

## 2014-09-15 DIAGNOSIS — Z8619 Personal history of other infectious and parasitic diseases: Secondary | ICD-10-CM | POA: Diagnosis not present

## 2014-09-15 DIAGNOSIS — Y998 Other external cause status: Secondary | ICD-10-CM | POA: Diagnosis not present

## 2014-09-15 DIAGNOSIS — Z87891 Personal history of nicotine dependence: Secondary | ICD-10-CM | POA: Diagnosis not present

## 2014-09-15 DIAGNOSIS — Z862 Personal history of diseases of the blood and blood-forming organs and certain disorders involving the immune mechanism: Secondary | ICD-10-CM | POA: Insufficient documentation

## 2014-09-15 DIAGNOSIS — M549 Dorsalgia, unspecified: Secondary | ICD-10-CM

## 2014-09-15 DIAGNOSIS — Z793 Long term (current) use of hormonal contraceptives: Secondary | ICD-10-CM | POA: Insufficient documentation

## 2014-09-15 DIAGNOSIS — Z91040 Latex allergy status: Secondary | ICD-10-CM | POA: Insufficient documentation

## 2014-09-15 DIAGNOSIS — Y9389 Activity, other specified: Secondary | ICD-10-CM | POA: Diagnosis not present

## 2014-09-15 DIAGNOSIS — Y9289 Other specified places as the place of occurrence of the external cause: Secondary | ICD-10-CM | POA: Diagnosis not present

## 2014-09-15 DIAGNOSIS — S3992XA Unspecified injury of lower back, initial encounter: Secondary | ICD-10-CM | POA: Insufficient documentation

## 2014-09-15 DIAGNOSIS — W108XXA Fall (on) (from) other stairs and steps, initial encounter: Secondary | ICD-10-CM | POA: Insufficient documentation

## 2014-09-15 DIAGNOSIS — Z880 Allergy status to penicillin: Secondary | ICD-10-CM | POA: Diagnosis not present

## 2014-09-15 MED ORDER — TRAMADOL HCL 50 MG PO TABS
50.0000 mg | ORAL_TABLET | Freq: Four times a day (QID) | ORAL | Status: DC | PRN
Start: 2014-09-15 — End: 2015-04-10

## 2014-09-15 MED ORDER — NAPROXEN 500 MG PO TABS: 500.0000 mg | ORAL_TABLET | Freq: Two times a day (BID) | ORAL | Status: DC

## 2014-09-15 NOTE — Discharge Instructions (Signed)
Take pain medication (Tramadol) as prescribed.  Do not drive or operate heavy machinery for 4-6 hours after taking medication.

## 2014-09-15 NOTE — ED Notes (Signed)
Patient states she was running down the steps at home last night and slipped. She fell onto her bottom and back and slid down several steps. She states she had pain last night and took ibuprofen prior to going to bed. She was in more pain this morning with difficulty sitting and standing.
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
## 2014-09-15 NOTE — ED Notes (Signed)
Patient transported to X-ray 

## 2015-04-10 ENCOUNTER — Emergency Department (HOSPITAL_COMMUNITY)
Admission: EM | Admit: 2015-04-10 | Discharge: 2015-04-10 | Disposition: A | Discharge status: Home / Self Care | Payer: BLUE CROSS/BLUE SHIELD | Attending: Emergency Medicine | Admitting: Emergency Medicine

## 2015-04-10 ENCOUNTER — Encounter (HOSPITAL_COMMUNITY): Payer: Self-pay | Admitting: Emergency Medicine

## 2015-04-10 DIAGNOSIS — A599 Trichomoniasis, unspecified: Secondary | ICD-10-CM

## 2015-04-10 DIAGNOSIS — N760 Acute vaginitis: Secondary | ICD-10-CM | POA: Diagnosis not present

## 2015-04-10 DIAGNOSIS — Z91040 Latex allergy status: Secondary | ICD-10-CM | POA: Insufficient documentation

## 2015-04-10 DIAGNOSIS — A5901 Trichomonal vulvovaginitis: Secondary | ICD-10-CM | POA: Diagnosis not present

## 2015-04-10 DIAGNOSIS — Z862 Personal history of diseases of the blood and blood-forming organs and certain disorders involving the immune mechanism: Secondary | ICD-10-CM | POA: Diagnosis not present

## 2015-04-10 DIAGNOSIS — N898 Other specified noninflammatory disorders of vagina: Secondary | ICD-10-CM | POA: Diagnosis present

## 2015-04-10 DIAGNOSIS — B9689 Other specified bacterial agents as the cause of diseases classified elsewhere: Secondary | ICD-10-CM

## 2015-04-10 DIAGNOSIS — Z3202 Encounter for pregnancy test, result negative: Secondary | ICD-10-CM | POA: Diagnosis not present

## 2015-04-10 DIAGNOSIS — Z880 Allergy status to penicillin: Secondary | ICD-10-CM | POA: Insufficient documentation

## 2015-04-10 LAB — URINE MICROSCOPIC-ADD ON

## 2015-04-10 LAB — WET PREP, GENITAL: YEAST WET PREP: NONE SEEN

## 2015-04-10 LAB — PREGNANCY, URINE: Preg Test, Ur: NEGATIVE

## 2015-04-10 LAB — URINALYSIS, ROUTINE W REFLEX MICROSCOPIC
Bilirubin Urine: NEGATIVE
Glucose, UA: NEGATIVE mg/dL
Hgb urine dipstick: NEGATIVE
KETONES UR: NEGATIVE mg/dL
NITRITE: NEGATIVE
PH: 7 (ref 5.0–8.0)
Protein, ur: NEGATIVE mg/dL
Specific Gravity, Urine: 1.021 (ref 1.005–1.030)
UROBILINOGEN UA: 0.2 mg/dL (ref 0.0–1.0)

## 2015-04-10 MED ORDER — METRONIDAZOLE 500 MG PO TABS
2000.0000 mg | ORAL_TABLET | Freq: Once | ORAL | Status: AC
  Administered 2015-04-10: 2000 mg via ORAL
  Filled 2015-04-10: qty 4

## 2015-04-10 NOTE — Discharge Instructions (Signed)
You have been treated for trichomonas and bacterial vaginosis. Refer to attached documents for more information.

## 2015-04-10 NOTE — ED Notes (Signed)
Pt. Stated, I stopped my period and now I've had constant sharpe pain in my vagina with mor than usual discharge
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
## 2015-04-11 LAB — GC/CHLAMYDIA PROBE AMP (~~LOC~~) NOT AT ARMC
CHLAMYDIA, DNA PROBE: NEGATIVE
Neisseria Gonorrhea: NEGATIVE

## 2015-07-22 ENCOUNTER — Inpatient Hospital Stay (HOSPITAL_COMMUNITY)
Admission: AD | Admit: 2015-07-22 | Discharge: 2015-07-23 | Disposition: A | Discharge status: Home / Self Care | Payer: Self-pay | Source: Ambulatory Visit | Attending: Obstetrics & Gynecology | Admitting: Obstetrics & Gynecology

## 2015-07-22 ENCOUNTER — Inpatient Hospital Stay (HOSPITAL_COMMUNITY): Payer: Medicaid Other

## 2015-07-22 DIAGNOSIS — Z87891 Personal history of nicotine dependence: Secondary | ICD-10-CM | POA: Insufficient documentation

## 2015-07-22 DIAGNOSIS — N201 Calculus of ureter: Secondary | ICD-10-CM | POA: Insufficient documentation

## 2015-07-22 DIAGNOSIS — N1330 Unspecified hydronephrosis: Secondary | ICD-10-CM

## 2015-07-22 LAB — URINALYSIS, ROUTINE W REFLEX MICROSCOPIC
BILIRUBIN URINE: NEGATIVE
GLUCOSE, UA: NEGATIVE mg/dL
Ketones, ur: 15 mg/dL — AB
Nitrite: NEGATIVE
Protein, ur: 30 mg/dL — AB
Specific Gravity, Urine: 1.015 (ref 1.005–1.030)
Urobilinogen, UA: 0.2 mg/dL (ref 0.0–1.0)
pH: 8.5 — ABNORMAL HIGH (ref 5.0–8.0)

## 2015-07-22 LAB — CBC
HEMATOCRIT: 35.3 % — AB (ref 36.0–46.0)
Hemoglobin: 11.9 g/dL — ABNORMAL LOW (ref 12.0–15.0)
MCH: 28.9 pg (ref 26.0–34.0)
MCHC: 33.7 g/dL (ref 30.0–36.0)
MCV: 85.7 fL (ref 78.0–100.0)
Platelets: 219 10*3/uL (ref 150–400)
RBC: 4.12 MIL/uL (ref 3.87–5.11)
RDW: 12.6 % (ref 11.5–15.5)
WBC: 13.5 10*3/uL — ABNORMAL HIGH (ref 4.0–10.5)

## 2015-07-22 LAB — COMPREHENSIVE METABOLIC PANEL
ALK PHOS: 40 U/L (ref 38–126)
ALT: 10 U/L — ABNORMAL LOW (ref 14–54)
ANION GAP: 8 (ref 5–15)
AST: 16 U/L (ref 15–41)
Albumin: 4.3 g/dL (ref 3.5–5.0)
BILIRUBIN TOTAL: 0.6 mg/dL (ref 0.3–1.2)
BUN: 14 mg/dL (ref 6–20)
CO2: 24 mmol/L (ref 22–32)
Calcium: 8.7 mg/dL — ABNORMAL LOW (ref 8.9–10.3)
Chloride: 107 mmol/L (ref 101–111)
Creatinine, Ser: 0.9 mg/dL (ref 0.44–1.00)
GFR calc non Af Amer: >60 mL/min (ref >60)
Glucose, Bld: 110 mg/dL — ABNORMAL HIGH (ref 65–99)
Potassium: 4.1 mmol/L (ref 3.5–5.1)
Sodium: 139 mmol/L (ref 135–145)
TOTAL PROTEIN: 7.2 g/dL (ref 6.5–8.1)

## 2015-07-22 LAB — URINE MICROSCOPIC-ADD ON

## 2015-07-22 LAB — WET PREP, GENITAL
Clue Cells Wet Prep HPF POC: NONE SEEN
Trich, Wet Prep: NONE SEEN
YEAST WET PREP: NONE SEEN

## 2015-07-22 LAB — POCT PREGNANCY, URINE: Preg Test, Ur: NEGATIVE

## 2015-07-22 MED ORDER — IOHEXOL 300 MG/ML  SOLN
50.0000 mL | INTRAMUSCULAR | Status: AC
  Administered 2015-07-22 (×2): 50 mL via ORAL

## 2015-07-22 MED ORDER — PROMETHAZINE HCL 25 MG/ML IJ SOLN
25.0000 mg | Freq: Once | INTRAMUSCULAR | Status: AC
  Administered 2015-07-23: 25 mg via INTRAVENOUS
  Filled 2015-07-22: qty 1

## 2015-07-22 MED ORDER — ONDANSETRON HCL 4 MG/2ML IJ SOLN
4.0000 mg | Freq: Once | INTRAMUSCULAR | Status: AC
  Administered 2015-07-22: 4 mg via INTRAVENOUS
  Filled 2015-07-22: qty 2

## 2015-07-22 MED ORDER — ONDANSETRON 8 MG PO TBDP: 8.0000 mg | ORAL_TABLET | ORAL | Status: DC

## 2015-07-22 MED ORDER — KETOROLAC TROMETHAMINE 60 MG/2ML IM SOLN
60.0000 mg | INTRAMUSCULAR | Status: AC
  Administered 2015-07-22: 60 mg via INTRAMUSCULAR
  Filled 2015-07-22: qty 2

## 2015-07-22 MED ORDER — IOHEXOL 300 MG/ML  SOLN
100.0000 mL | Freq: Once | INTRAMUSCULAR | Status: AC | PRN
  Administered 2015-07-22: 100 mL via INTRAVENOUS

## 2015-07-22 MED ORDER — SODIUM CHLORIDE 0.9 % IV SOLN
INTRAVENOUS | Status: DC
  Administered 2015-07-22: 22:00:00 via INTRAVENOUS

## 2015-07-22 MED ORDER — HYDROMORPHONE HCL 2 MG/ML IJ SOLN
2.0000 mg | INTRAMUSCULAR | Status: AC
  Administered 2015-07-23: 2 mg via INTRAVENOUS
  Filled 2015-07-22: qty 1

## 2015-07-22 NOTE — MAU Note (Signed)
Pt c/o RLQ pain that began this morning. Took Tylenol, ibuprofen and nothing helping. Has some n/v that started right before she got here. Still has appendix. LMP: 06/04/2015. Having some spotting. Denies fever. Denies urinary s/s.
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
## 2015-07-22 NOTE — Progress Notes (Signed)
Report called to Sherre Scarlet, CNM re: right lower quadrant pain, pregnancy test negative. Looked at records and hasn't been seen in the office since 2014, so requested that the MAU provider see her instead.

## 2015-07-23 DIAGNOSIS — N201 Calculus of ureter: Secondary | ICD-10-CM

## 2015-07-23 DIAGNOSIS — N1330 Unspecified hydronephrosis: Secondary | ICD-10-CM

## 2015-07-23 LAB — GC/CHLAMYDIA PROBE AMP (~~LOC~~) NOT AT ARMC
Chlamydia: NEGATIVE
Neisseria Gonorrhea: NEGATIVE

## 2015-07-23 MED ORDER — PROMETHAZINE HCL 25 MG PO TABS: 12.5000 mg | ORAL_TABLET | Freq: Four times a day (QID) | ORAL | Status: DC | PRN

## 2015-07-23 MED ORDER — OXYCODONE-ACETAMINOPHEN 5-325 MG PO TABS: 1.0000 | ORAL_TABLET | Freq: Four times a day (QID) | ORAL | Status: DC | PRN

## 2015-07-23 MED ORDER — OXYCODONE-ACETAMINOPHEN 5-325 MG PO TABS
2.0000 | ORAL_TABLET | Freq: Once | ORAL | Status: AC
  Administered 2015-07-23: 2 via ORAL
  Filled 2015-07-23: qty 2

## 2015-07-23 MED ORDER — SODIUM CHLORIDE 0.9 % IV BOLUS (SEPSIS)
1000.0000 mL | Freq: Once | INTRAVENOUS | Status: AC
  Administered 2015-07-23: 1000 mL via INTRAVENOUS

## 2015-07-23 NOTE — Discharge Instructions (Signed)
Hydronephrosis °Hydronephrosis is an abnormal enlargement of your kidney. It can affect one or both the kidneys. It results from the backward pressure of urine on the kidneys, when the flow of urine is blocked. Normally, the urine drains from the kidney through the urine tube (ureter), into a sac which holds the urine until urination (bladder). When the urinary flow is blocked, the urine collects above the block. This causes an increase in the pressure inside the kidney, which in turn leads to its enlargement. The block can occur at the point where the kidney joins the ureter. Treatment depends on the cause and location of the block.  °CAUSES  °The causes of this condition include: °· Birth defect of the kidney or ureter. °· Kink at the point where the kidney joins the ureter. °· Stones and blood clots in the kidney or ureter. °· Cancer, injury, or infection of the ureter. °· Scar tissue formation. °· Backflow of urine (reflux). °· Cancer of bladder or prostate gland. °· Abnormality of the nerves or muscles of the kidney or ureter. °· Lower part of the ureter protruding into the bladder (ureterocele). °· Abnormal contractions of the bladder. °· Both the kidneys can be affected during pregnancy. This is because the enlarging uterus presses on the ureters and blocks the flow of urine. °SYMPTOMS  °The symptoms depend on the location of the block. They also depend on how long the block has been present. You may feel pain on the affected side. Sometimes, you may not have any symptoms. There may be a dull ache or discomfort in the flank. The common symptoms are: °· Flank pain. °· Swelling of the abdomen. °· Pain in the abdomen. °· Nausea and vomiting. °· Fever. °· Pain while passing urine. °· Urgency for urination. °· Frequent or urgent urination. °· Infection of the urinary tract. °DIAGNOSIS  °Your caregiver will examine you after asking about your symptoms. You may be asked to do blood and urine tests. Your caregiver  may order a special X-ray, ultrasound, or CT scan. Sometimes a rigid or flexible telescope (cystoscope) is used to view the site of the blockage.  °TREATMENT  °Treatment depends on the site, cause, and duration of the block. The goal of treatment is to remove the blockage. Your caregiver will plan the treatment based on your condition. The different types of treatment are:  °· Putting in a soft plastic tube (ureteral stent) to connect the bladder with the kidney. This will help in draining the urine. °· Putting in a soft tube (nephrostomy tube). This is placed through skin into the kidney. The trapped urine is drained out through the back. A plastic bag is attached to your skin to hold the urine that has drained out. °· Antibiotics to treat or prevent infection. °· Breaking down of the stone (lithotripsy). °HOME CARE INSTRUCTIONS  °· It may take some time for the hydronephrosis to go away (resolve). Drink fluids as directed by your caregiver , and get a lot of rest. °· If you have a drain in, your caregiver will give you directions about how to care for it. Be sure you understand these directions completely before you go home. °· Take any antibiotics, pain medications, or other prescriptions exactly as prescribed. °· Follow-up with your caregivers as directed. °SEEK MEDICAL CARE IF:  °· You continue to have flank pain, nausea, or difficulty with urination. °· You have any problem with any type of drainage device. °· Your urine becomes cloudy or bloody. °SEEK   IMMEDIATE MEDICAL CARE IF:  °· You have severe flank and/or abdominal pain. °· You develop vomiting and are unable to hold down fluids. °· You develop a fever above 100.5° F (38.1° C), or as per your caregiver. °MAKE SURE YOU:  °· Understand these instructions. °· Will watch your condition. °· Will get help right away if you are not doing well or get worse. °Document Released: 08/10/2007 Document Revised: 01/05/2012 Document Reviewed: 09/26/2010 °Exit[employer]®  Patient Information ©2015 [employer]. This information is not intended to replace advice given to you by your health care provider. Make sure you discuss any questions you have with your health care provider. ° °Kidney Stones °Kidney stones (urolithiasis) are deposits that form inside your kidneys. The intense pain is caused by the stone moving through the urinary tract. When the stone moves, the ureter goes into spasm around the stone. The stone is usually passed in the urine.  °CAUSES  °· A disorder that makes certain neck glands produce too much parathyroid hormone (primary hyperparathyroidism). °· A buildup of uric acid crystals, similar to gout in your joints. °· Narrowing (stricture) of the ureter. °· A kidney obstruction present at birth (congenital obstruction). °· Previous surgery on the kidney or ureters. °· Numerous kidney infections. °SYMPTOMS  °· Feeling sick to your stomach (nauseous). °· Throwing up (vomiting). °· Blood in the urine (hematuria). °· Pain that usually spreads (radiates) to the groin. °· Frequency or urgency of urination. °DIAGNOSIS  °· Taking a history and physical exam. °· Blood or urine tests. °· CT scan. °· Occasionally, an examination of the inside of the urinary bladder (cystoscopy) is performed. °TREATMENT  °· Observation. °· Increasing your fluid intake. °· Extracorporeal shock wave lithotripsy--This is a noninvasive procedure that uses shock waves to break up kidney stones. °· Surgery may be needed if you have severe pain or persistent obstruction. There are various surgical procedures. Most of the procedures are performed with the use of small instruments. Only small incisions are needed to accommodate these instruments, so recovery time is minimized. °The size, location, and chemical composition are all important variables that will determine the proper choice of action for you. Talk to your health care provider to better understand your situation so that you will minimize  the risk of injury to yourself and your kidney.  °HOME CARE INSTRUCTIONS  °· Drink enough water and fluids to keep your urine clear or pale yellow. This will help you to pass the stone or stone fragments. °· Strain all urine through the provided strainer. Keep all particulate matter and stones for your health care provider to see. The stone causing the pain may be as small as a grain of salt. It is very important to use the strainer each and every time you pass your urine. The collection of your stone will allow your health care provider to analyze it and verify that a stone has actually passed. The stone analysis will often identify what you can do to reduce the incidence of recurrences. °· Only take over-the-counter or prescription medicines for pain, discomfort, or fever as directed by your health care provider. °· Make a follow-up appointment with your health care provider as directed. °· Get follow-up X-rays if required. The absence of pain does not always mean that the stone has passed. It may have only stopped moving. If the urine remains completely obstructed, it can cause loss of kidney function or even complete destruction of the kidney. It is your responsibility to make sure   X-rays and follow-ups are completed. Ultrasounds of the kidney can show blockages and the status of the kidney. Ultrasounds are not associated with any radiation and can be performed easily in a matter of minutes. °SEEK MEDICAL CARE IF: °· You experience pain that is progressive and unresponsive to any pain medicine you have been prescribed. °SEEK IMMEDIATE MEDICAL CARE IF:  °· Pain cannot be controlled with the prescribed medicine. °· You have a fever or shaking chills. °· The severity or intensity of pain increases over 18 hours and is not relieved by pain medicine. °· You develop a new onset of abdominal pain. °· You feel faint or pass out. °· You are unable to urinate. °MAKE SURE YOU:  °· Understand these instructions. °· Will  watch your condition. °· Will get help right away if you are not doing well or get worse. °Document Released: 10/13/2005 Document Revised: 06/15/2013 Document Reviewed: 03/16/2013 °ExitCare® Patient Information ©2015 [employer]. This information is not intended to replace advice given to you by your health care provider. Make sure you discuss any questions you have with your health care provider. ° °

## 2015-07-30 ENCOUNTER — Encounter (HOSPITAL_COMMUNITY): Payer: Self-pay | Admitting: *Deleted

## 2015-07-30 ENCOUNTER — Emergency Department (HOSPITAL_COMMUNITY)
Admission: EM | Admit: 2015-07-30 | Discharge: 2015-07-30 | Discharge status: Against Medical Advice | Payer: Medicaid Other | Attending: Emergency Medicine | Admitting: Emergency Medicine

## 2015-07-30 DIAGNOSIS — R109 Unspecified abdominal pain: Secondary | ICD-10-CM | POA: Insufficient documentation

## 2015-07-30 NOTE — ED Notes (Signed)
Patient does not want anything that does not have to be done like blood draws because she just [facility].  I made the nurse aware.

## 2015-07-30 NOTE — ED Notes (Signed)
Pt wants to go back to [facility] because she does not want to wait here.

## 2015-07-30 NOTE — ED Notes (Signed)
Pt reports being discharged from the hospital on the 26th with several kidney stones and nephrolithiasis.  Called her urologist and is scheduled to see them this week.  Pt reports R flank pain.  Had ran out of pain meds.

## 2015-08-16 ENCOUNTER — Emergency Department (HOSPITAL_COMMUNITY)
Admission: EM | Admit: 2015-08-16 | Discharge: 2015-08-16 | Disposition: A | Discharge status: Home / Self Care | Payer: Medicaid Other | Attending: Emergency Medicine | Admitting: Emergency Medicine

## 2015-08-16 ENCOUNTER — Encounter (HOSPITAL_COMMUNITY): Payer: Self-pay | Admitting: Neurology

## 2015-08-16 DIAGNOSIS — Z87891 Personal history of nicotine dependence: Secondary | ICD-10-CM | POA: Insufficient documentation

## 2015-08-16 DIAGNOSIS — Z862 Personal history of diseases of the blood and blood-forming organs and certain disorders involving the immune mechanism: Secondary | ICD-10-CM | POA: Insufficient documentation

## 2015-08-16 DIAGNOSIS — Z880 Allergy status to penicillin: Secondary | ICD-10-CM | POA: Insufficient documentation

## 2015-08-16 DIAGNOSIS — O98811 Other maternal infectious and parasitic diseases complicating pregnancy, first trimester: Secondary | ICD-10-CM | POA: Insufficient documentation

## 2015-08-16 DIAGNOSIS — A5909 Other urogenital trichomoniasis: Secondary | ICD-10-CM

## 2015-08-16 DIAGNOSIS — Z3A10 10 weeks gestation of pregnancy: Secondary | ICD-10-CM | POA: Insufficient documentation

## 2015-08-16 DIAGNOSIS — O23511 Infections of cervix in pregnancy, first trimester: Secondary | ICD-10-CM | POA: Insufficient documentation

## 2015-08-16 DIAGNOSIS — Z91040 Latex allergy status: Secondary | ICD-10-CM | POA: Insufficient documentation

## 2015-08-16 DIAGNOSIS — Z3490 Encounter for supervision of normal pregnancy, unspecified, unspecified trimester: Secondary | ICD-10-CM

## 2015-08-16 DIAGNOSIS — A5901 Trichomonal vulvovaginitis: Secondary | ICD-10-CM | POA: Insufficient documentation

## 2015-08-16 LAB — WET PREP, GENITAL: Yeast Wet Prep HPF POC: NONE SEEN

## 2015-08-16 MED ORDER — METRONIDAZOLE 500 MG PO TABS
2000.0000 mg | ORAL_TABLET | Freq: Once | ORAL | Status: AC
  Administered 2015-08-16: 2000 mg via ORAL
  Filled 2015-08-16: qty 4

## 2015-08-16 MED ORDER — AZITHROMYCIN 250 MG PO TABS
2000.0000 mg | ORAL_TABLET | Freq: Once | ORAL | Status: AC
  Administered 2015-08-16: 2000 mg via ORAL
  Filled 2015-08-16: qty 8

## 2015-08-16 NOTE — Discharge Instructions (Signed)
Cervicitis Cervicitis is a soreness and swelling (inflammation) of the cervix. Your cervix is located at the bottom of your uterus. It opens up to the vagina. CAUSES   Sexually transmitted infections (STIs).   Allergic reaction.   Medicines or birth control devices that are put in the vagina.   Injury to the cervix.   Bacterial infections.  RISK FACTORS You are at greater risk if you:  Have unprotected sexual intercourse.  Have sexual intercourse with many partners.  Began sexual intercourse at an early age.  Have a history of STIs. SYMPTOMS  There may be no symptoms. If symptoms occur, they may include:   Gray, white, yellow, or bad-smelling vaginal discharge.   Pain or itching of the area outside the vagina.   Painful sexual intercourse.   Lower abdominal or lower back pain, especially during intercourse.   Frequent urination.   Abnormal vaginal bleeding between periods, after sexual intercourse, or after menopause.   Pressure or a heavy feeling in the pelvis.  DIAGNOSIS  Diagnosis is made after a pelvic exam. Other tests may include:   Examination of any discharge under a microscope (wet prep).   A Pap test.  TREATMENT  Treatment will depend on the cause of cervicitis. If it is caused by an STI, both you and your partner will need to be treated. Antibiotic medicines will be given.  HOME CARE INSTRUCTIONS   Do not have sexual intercourse until your health care provider says it is okay.   Do not have sexual intercourse until your partner has been treated, if your cervicitis is caused by an STI.   Take your antibiotics as directed. Finish them even if you start to feel better.  SEEK MEDICAL CARE IF:  Your symptoms come back.   You have a fever.  MAKE SURE YOU:   Understand these instructions.  Will watch your condition.  Will get help right away if you are not doing well or get worse.   This information is not intended to replace  advice given to you by your health care provider. Make sure you discuss any questions you have with your health care provider.   Document Released: 10/13/2005 Document Revised: 10/18/2013 Document Reviewed: 04/06/2013 Elsevier Interactive Patient Education [employer]2016 [employer] Inc[employer]  First Trimester of Pregnancy The first trimester of pregnancy is from week 1 until the end of week 12 (months 1 through 3). A week after a sperm fertilizes an egg, the egg will implant on the wall of the uterus. This embryo will begin to develop into a baby. Genes from you and your partner are forming the baby. The female genes determine whether the baby is a boy or a girl. At 6-8 weeks, the eyes and face are formed, and the heartbeat can be seen on ultrasound. At the end of 12 weeks, all the baby's organs are formed.  Now that you are pregnant, you will want to do everything you can to have a healthy baby. Two of the most important things are to get good prenatal care and to follow your health care provider's instructions. Prenatal care is all the medical care you receive before the baby's birth. This care will help prevent, find, and treat any problems during the pregnancy and childbirth. BODY CHANGES Your body goes through many changes during pregnancy. The changes vary from woman to woman.   You may gain or lose a couple of pounds at first.  You may feel sick to your stomach (nauseous) and throw up (  vomit). If the vomiting is uncontrollable, call your health care provider.  You may tire easily.  You may develop headaches that can be relieved by medicines approved by your health care provider.  You may urinate more often. Painful urination may mean you have a bladder infection.  You may develop heartburn as a result of your pregnancy.  You may develop constipation because certain hormones are causing the muscles that push waste through your intestines to slow down.  You may develop hemorrhoids or swollen, bulging  veins (varicose veins).  Your breasts may begin to grow larger and become tender. Your nipples may stick out more, and the tissue that surrounds them (areola) may become darker.  Your gums may bleed and may be sensitive to brushing and flossing.  Dark spots or blotches (chloasma, mask of pregnancy) may develop on your face. This will likely fade after the baby is born.  Your menstrual periods will stop.  You may have a loss of appetite.  You may develop cravings for certain kinds of food.  You may have changes in your emotions from day to day, such as being excited to be pregnant or being concerned that something may go wrong with the pregnancy and baby.  You may have more vivid and strange dreams.  You may have changes in your hair. These can include thickening of your hair, rapid growth, and changes in texture. Some women also have hair loss during or after pregnancy, or hair that feels dry or thin. Your hair will most likely return to normal after your baby is born. WHAT TO EXPECT AT YOUR PRENATAL VISITS During a routine prenatal visit:  You will be weighed to make sure you and the baby are growing normally.  Your blood pressure will be taken.  Your abdomen will be measured to track your baby's growth.  The fetal heartbeat will be listened to starting around week 10 or 12 of your pregnancy.  Test results from any previous visits will be discussed. Your health care provider may ask you:  How you are feeling.  If you are feeling the baby move.  If you have had any abnormal symptoms, such as leaking fluid, bleeding, severe headaches, or abdominal cramping.  If you are using any tobacco products, including cigarettes, chewing tobacco, and electronic cigarettes.  If you have any questions. Other tests that may be performed during your first trimester include:  Blood tests to find your blood type and to check for the presence of any previous infections. They will also be used  to check for low iron levels (anemia) and Rh antibodies. Later in the pregnancy, blood tests for diabetes will be done along with other tests if problems develop.  Urine tests to check for infections, diabetes, or protein in the urine.  An ultrasound to confirm the proper growth and development of the baby.  An amniocentesis to check for possible genetic problems.  Fetal screens for spina bifida and Down syndrome.  You may need other tests to make sure you and the baby are doing well.  HIV (human immunodeficiency virus) testing. Routine prenatal testing includes screening for HIV, unless you choose not to have this test. HOME CARE INSTRUCTIONS  Medicines  Follow your health care provider's instructions regarding medicine use. Specific medicines may be either safe or unsafe to take during pregnancy.  Take your prenatal vitamins as directed.  If you develop constipation, try taking a stool softener if your health care provider approves. Diet  Eat regular,  well-balanced meals. Choose a variety of foods, such as meat or vegetable-based protein, fish, milk and low-fat dairy products, vegetables, fruits, and whole grain breads and cereals. Your health care provider will help you determine the amount of weight gain that is right for you.  Avoid raw meat and uncooked cheese. These carry germs that can cause birth defects in the baby.  Eating four or five small meals rather than three large meals a day may help relieve nausea and vomiting. If you start to feel nauseous, eating a few soda crackers can be helpful. Drinking liquids between meals instead of during meals also seems to help nausea and vomiting.  If you develop constipation, eat more high-fiber foods, such as fresh vegetables or fruit and whole grains. Drink enough fluids to keep your urine clear or pale yellow. Activity and Exercise  Exercise only as directed by your health care provider. Exercising will help you:  Control your  weight.  Stay in shape.  Be prepared for labor and delivery.  Experiencing pain or cramping in the lower abdomen or low back is a good sign that you should stop exercising. Check with your health care provider before continuing normal exercises.  Try to avoid standing for long periods of time. Move your legs often if you must stand in one place for a long time.  Avoid heavy lifting.  Wear low-heeled shoes, and practice good posture.  You may continue to have sex unless your health care provider directs you otherwise. Relief of Pain or Discomfort  Wear a good support bra for breast tenderness.   Take warm sitz baths to soothe any pain or discomfort caused by hemorrhoids. Use hemorrhoid cream if your health care provider approves.   Rest with your legs elevated if you have leg cramps or low back pain.  If you develop varicose veins in your legs, wear support hose. Elevate your feet for 15 minutes, 3-4 times a day. Limit salt in your diet. Prenatal Care  Schedule your prenatal visits by the twelfth week of pregnancy. They are usually scheduled monthly at first, then more often in the last 2 months before delivery.  Write down your questions. Take them to your prenatal visits.  Keep all your prenatal visits as directed by your health care provider. Safety  Wear your seat belt at all times when driving.  Make a list of emergency phone numbers, including numbers for family, friends, the hospital, and police and fire departments. General Tips  Ask your health care provider for a referral to a local prenatal education class. Begin classes no later than at the beginning of month 6 of your pregnancy.  Ask for help if you have counseling or nutritional needs during pregnancy. Your health care provider can offer advice or refer you to specialists for help with various needs.  Do not use hot tubs, steam rooms, or saunas.  Do not douche or use tampons or scented sanitary pads.  Do  not cross your legs for long periods of time.  Avoid cat litter boxes and soil used by cats. These carry germs that can cause birth defects in the baby and possibly loss of the fetus by miscarriage or stillbirth.  Avoid all smoking, herbs, alcohol, and medicines not prescribed by your health care provider. Chemicals in these affect the formation and growth of the baby.  Do not use any tobacco products, including cigarettes, chewing tobacco, and electronic cigarettes. If you need help quitting, ask your health care provider. You may  receive counseling support and other resources to help you quit.  Schedule a dentist appointment. At home, brush your teeth with a soft toothbrush and be gentle when you floss. SEEK MEDICAL CARE IF:   You have dizziness.  You have mild pelvic cramps, pelvic pressure, or nagging pain in the abdominal area.  You have persistent nausea, vomiting, or diarrhea.  You have a bad smelling vaginal discharge.  You have pain with urination.  You notice increased swelling in your face, hands, legs, or ankles. SEEK IMMEDIATE MEDICAL CARE IF:   You have a fever.  You are leaking fluid from your vagina.  You have spotting or bleeding from your vagina.  You have severe abdominal cramping or pain.  You have rapid weight gain or loss.  You vomit blood or material that looks like coffee grounds.  You are exposed to [place] measles and have never had them.  You are exposed to fifth disease or chickenpox.  You develop a severe headache.  You have shortness of breath.  You have any kind of trauma, such as from a fall or a car accident.   This information is not intended to replace advice given to you by your health care provider. Make sure you discuss any questions you have with your health care provider.   Document Released: 10/07/2001 Document Revised: 11/03/2014 Document Reviewed: 08/23/2013 Elsevier Interactive Patient Education 2016 [employer].  Sexually Transmitted Disease A sexually transmitted disease (STD) is a disease or infection that may be passed (transmitted) from person to person, usually during sexual activity. This may happen by way of saliva, semen, blood, vaginal mucus, or urine. Common STDs include:  Gonorrhea.  Chlamydia.  Syphilis.  HIV and AIDS.  Genital herpes.  Hepatitis B and C.  Trichomonas.  Human papillomavirus (HPV).  Pubic lice.  Scabies.  Mites.  Bacterial vaginosis. WHAT ARE CAUSES OF STDs? An STD may be caused by bacteria, a virus, or parasites. STDs are often transmitted during sexual activity if one person is infected. However, they may also be transmitted through nonsexual means. STDs may be transmitted after:   Sexual intercourse with an infected person.  Sharing sex toys with an infected person.  Sharing needles with an infected person or using unclean piercing or tattoo needles.  Having intimate contact with the genitals, mouth, or rectal areas of an infected person.  Exposure to infected fluids during birth. WHAT ARE THE SIGNS AND SYMPTOMS OF STDs? Different STDs have different symptoms. Some people may not have any symptoms. If symptoms are present, they may include:  Painful or bloody urination.  Pain in the pelvis, abdomen, vagina, anus, throat, or eyes.  A skin rash, itching, or irritation.  Growths, ulcerations, blisters, or sores in the genital and anal areas.  Abnormal vaginal discharge with or without bad odor.  Penile discharge in men.  Fever.  Pain or bleeding during sexual intercourse.  Swollen glands in the groin area.  Yellow skin and eyes (jaundice). This is seen with hepatitis.  Swollen testicles.  Infertility.  Sores and blisters in the mouth. HOW ARE STDs DIAGNOSED? To make a diagnosis, your health care provider may:  Take a medical history.  Perform a physical exam.  Take a sample of any discharge to examine.  Swab the  throat, cervix, opening to the penis, rectum, or vagina for testing.  Test a sample of your first morning urine.  Perform blood tests.  Perform a Pap test, if this applies.  Perform a colposcopy.  Perform a laparoscopy. HOW ARE STDs TREATED? Treatment depends on the STD. Some STDs may be treated but not cured.  Chlamydia, gonorrhea, trichomonas, and syphilis can be cured with antibiotic medicine.  Genital herpes, hepatitis, and HIV can be treated, but not cured, with prescribed medicines. The medicines lessen symptoms.  Genital warts from HPV can be treated with medicine or by freezing, burning (electrocautery), or surgery. Warts may come back.  HPV cannot be cured with medicine or surgery. However, abnormal areas may be removed from the cervix, vagina, or vulva.  If your diagnosis is confirmed, your recent sexual partners need treatment. This is true even if they are symptom-free or have a negative culture or evaluation. They should not have sex until their health care providers say it is okay.  Your health care provider may test you for infection again 3 months after treatment. HOW CAN I REDUCE MY RISK OF GETTING AN STD? Take these steps to reduce your risk of getting an STD:  Use latex condoms, dental dams, and water-soluble lubricants during sexual activity. Do not use petroleum jelly or oils.  Avoid having multiple sex partners.  Do not have sex with someone who has other sex partners  Do not have sex with anyone you do not know or who is at high risk for an STD.  Avoid risky sex practices that can break your skin.  Do not have sex if you have open sores on your mouth or skin.  Avoid drinking too much alcohol or taking illegal drugs. Alcohol and drugs can affect your judgment and put you in a vulnerable position.  Avoid engaging in oral and anal sex acts.  Get vaccinated for HPV and hepatitis. If you have not received these vaccines in the past, talk to your health  care provider about whether one or both might be right for you.  If you are at risk of being infected with HIV, it is recommended that you take a prescription medicine daily to prevent HIV infection. This is called pre-exposure prophylaxis (PrEP). You are considered at risk if:  You are a man who has sex with other men (MSM).  You are a heterosexual man or woman and are sexually active with more than one partner.  You take drugs by injection.  You are sexually active with a partner who has HIV.  Talk with your health care provider about whether you are at high risk of being infected with HIV. If you choose to begin PrEP, you should first be tested for HIV. You should then be tested every 3 months for as long as you are taking PrEP. WHAT SHOULD I DO IF I THINK I HAVE AN STD?  See your health care provider.  Tell your sexual partner(s). They should be tested and treated for any STDs.  Do not have sex until your health care provider says it is okay. WHEN SHOULD I GET IMMEDIATE MEDICAL CARE? Contact your health care provider right away if:   You have severe abdominal pain.  You are a man and notice swelling or pain in your testicles.  You are a woman and notice swelling or pain in your vagina.   This information is not intended to replace advice given to you by your health care provider. Make sure you discuss any questions you have with your health care provider.   Document Released: 01/03/2003 Document Revised: 11/03/2014 Document Reviewed: 05/03/2013 Elsevier Interactive Patient Education 2016 [employer].  Trichomoniasis Trichomoniasis is an infection caused by  an organism called Trichomonas. The infection can affect both women and men. In women, the outer female genitalia and the vagina are affected. In men, the penis is mainly affected, but the prostate and other reproductive organs can also be involved. Trichomoniasis is a sexually transmitted infection (STI) and is most often  passed to another person through sexual contact.  RISK FACTORS  Having unprotected sexual intercourse.  Having sexual intercourse with an infected partner. SIGNS AND SYMPTOMS  Symptoms of trichomoniasis in women include:  Abnormal gray-green frothy vaginal discharge.  Itching and irritation of the vagina.  Itching and irritation of the area outside the vagina. Symptoms of trichomoniasis in men include:   Penile discharge with or without pain.  Pain during urination. This results from inflammation of the urethra. DIAGNOSIS  Trichomoniasis may be found during a Pap test or physical exam. Your health care provider may use one of the following methods to help diagnose this infection:  Testing the pH of the vagina with a test tape.  Using a vaginal swab test that checks for the Trichomonas organism. A test is available that provides results within a few minutes.  Examining a urine sample.  Testing vaginal secretions. Your health care provider may test you for other STIs, including HIV. TREATMENT   You may be given medicine to fight the infection. Women should inform their health care provider if they could be or are pregnant. Some medicines used to treat the infection should not be taken during pregnancy.  Your health care provider may recommend over-the-counter medicines or creams to decrease itching or irritation.  Your sexual partner will need to be treated if infected.  Your health care provider may test you for infection again 3 months after treatment. HOME CARE INSTRUCTIONS   Take medicines only as directed by your health care provider.  Take over-the-counter medicine for itching or irritation as directed by your health care provider.  Do not have sexual intercourse while you have the infection.  Women should not douche or wear tampons while they have the infection.  Discuss your infection with your partner. Your partner may have gotten the infection from you, or you  may have gotten it from your partner.  Have your sex partner get examined and treated if necessary.  Practice safe, informed, and protected sex.  See your health care provider for other STI testing. SEEK MEDICAL CARE IF:   You still have symptoms after you finish your medicine.  You develop abdominal pain.  You have pain when you urinate.  You have bleeding after sexual intercourse.  You develop a rash.  Your medicine makes you sick or makes you throw up (vomit). MAKE SURE YOU:  Understand these instructions.  Will watch your condition.  Will get help right away if you are not doing well or get worse.   This information is not intended to replace advice given to you by your health care provider. Make sure you discuss any questions you have with your health care provider.   MUST RETURN TO THE EMERGENCY DEPARTMENT IF YOU HAVE A POSITIVE GONORRHEA TEST. WILL REQUIRE DESENSITIZATION FOR TREATMENT. YOU WILL BE CALLED IN APPROXIMATELY 72 HOURS WITH STD RESULTS. RETURN TO THE ED SOONER IF YOU EXPERIENCE ABDOMINAL PAIN, FEVER, VOMITING, VAGINAL BLEEDING.

## 2015-08-16 NOTE — ED Notes (Addendum)
Pt reports told by her partner she has trichomonas. Is here for STD check and vaginal discharge.Pt reports she is [redacted] weeks pregnant.

## 2015-08-17 LAB — HIV ANTIBODY (ROUTINE TESTING W REFLEX): HIV Screen 4th Generation wRfx: NONREACTIVE

## 2015-08-17 LAB — GC/CHLAMYDIA PROBE AMP (~~LOC~~) NOT AT ARMC
Chlamydia: NEGATIVE
Neisseria Gonorrhea: NEGATIVE
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
## 2015-08-22 ENCOUNTER — Emergency Department (HOSPITAL_COMMUNITY): Payer: Medicaid Other

## 2015-08-22 ENCOUNTER — Emergency Department (HOSPITAL_COMMUNITY)
Admission: EM | Admit: 2015-08-22 | Discharge: 2015-08-22 | Disposition: A | Discharge status: Home / Self Care | Payer: Medicaid Other | Attending: Emergency Medicine | Admitting: Emergency Medicine

## 2015-08-22 DIAGNOSIS — Z91040 Latex allergy status: Secondary | ICD-10-CM | POA: Insufficient documentation

## 2015-08-22 DIAGNOSIS — Z87891 Personal history of nicotine dependence: Secondary | ICD-10-CM | POA: Insufficient documentation

## 2015-08-22 DIAGNOSIS — N390 Urinary tract infection, site not specified: Secondary | ICD-10-CM

## 2015-08-22 DIAGNOSIS — O98311 Other infections with a predominantly sexual mode of transmission complicating pregnancy, first trimester: Secondary | ICD-10-CM | POA: Insufficient documentation

## 2015-08-22 DIAGNOSIS — Z3A10 10 weeks gestation of pregnancy: Secondary | ICD-10-CM | POA: Insufficient documentation

## 2015-08-22 DIAGNOSIS — Z3490 Encounter for supervision of normal pregnancy, unspecified, unspecified trimester: Secondary | ICD-10-CM

## 2015-08-22 DIAGNOSIS — R109 Unspecified abdominal pain: Secondary | ICD-10-CM

## 2015-08-22 DIAGNOSIS — Z8619 Personal history of other infectious and parasitic diseases: Secondary | ICD-10-CM | POA: Insufficient documentation

## 2015-08-22 DIAGNOSIS — Z880 Allergy status to penicillin: Secondary | ICD-10-CM | POA: Insufficient documentation

## 2015-08-22 DIAGNOSIS — Z87442 Personal history of urinary calculi: Secondary | ICD-10-CM | POA: Insufficient documentation

## 2015-08-22 DIAGNOSIS — Z862 Personal history of diseases of the blood and blood-forming organs and certain disorders involving the immune mechanism: Secondary | ICD-10-CM | POA: Insufficient documentation

## 2015-08-22 DIAGNOSIS — O2391 Unspecified genitourinary tract infection in pregnancy, first trimester: Secondary | ICD-10-CM | POA: Insufficient documentation

## 2015-08-22 DIAGNOSIS — A599 Trichomoniasis, unspecified: Secondary | ICD-10-CM

## 2015-08-22 LAB — CBC WITH DIFFERENTIAL/PLATELET
Basophils Absolute: 0 10*3/uL (ref 0.0–0.1)
Basophils Relative: 0 %
Eosinophils Absolute: 0 10*3/uL (ref 0.0–0.7)
Eosinophils Relative: 0 %
HEMATOCRIT: 37.4 % (ref 36.0–46.0)
HEMOGLOBIN: 12.4 g/dL (ref 12.0–15.0)
LYMPHS ABS: 1.7 10*3/uL (ref 0.7–4.0)
LYMPHS PCT: 25 %
MCH: 28.4 pg (ref 26.0–34.0)
MCHC: 33.2 g/dL (ref 30.0–36.0)
MCV: 85.8 fL (ref 78.0–100.0)
Monocytes Absolute: 0.5 10*3/uL (ref 0.1–1.0)
Monocytes Relative: 7 %
NEUTROS ABS: 4.7 10*3/uL (ref 1.7–7.7)
NEUTROS PCT: 68 %
Platelets: 222 10*3/uL (ref 150–400)
RBC: 4.36 MIL/uL (ref 3.87–5.11)
RDW: 12.8 % (ref 11.5–15.5)
WBC: 6.9 10*3/uL (ref 4.0–10.5)

## 2015-08-22 LAB — URINE MICROSCOPIC-ADD ON

## 2015-08-22 LAB — I-STAT BETA HCG BLOOD, ED (MC, WL, AP ONLY): HCG, QUANTITATIVE: 21.3 m[IU]/mL — AB (ref <5)

## 2015-08-22 LAB — URINALYSIS, ROUTINE W REFLEX MICROSCOPIC
BILIRUBIN URINE: NEGATIVE
Glucose, UA: NEGATIVE mg/dL
KETONES UR: 15 mg/dL — AB
NITRITE: NEGATIVE
PH: 6 (ref 5.0–8.0)
Protein, ur: NEGATIVE mg/dL
SPECIFIC GRAVITY, URINE: 1.032 — AB (ref 1.005–1.030)
UROBILINOGEN UA: 0.2 mg/dL (ref 0.0–1.0)

## 2015-08-22 LAB — BASIC METABOLIC PANEL
Anion gap: 8 (ref 5–15)
BUN: 7 mg/dL (ref 6–20)
CHLORIDE: 106 mmol/L (ref 101–111)
CO2: 24 mmol/L (ref 22–32)
Calcium: 9 mg/dL (ref 8.9–10.3)
Creatinine, Ser: 0.79 mg/dL (ref 0.44–1.00)
GFR calc Af Amer: >60 mL/min (ref >60)
GFR calc non Af Amer: >60 mL/min (ref >60)
GLUCOSE: 92 mg/dL (ref 65–99)
Potassium: 3.3 mmol/L — ABNORMAL LOW (ref 3.5–5.1)
Sodium: 138 mmol/L (ref 135–145)

## 2015-08-22 LAB — WET PREP, GENITAL: YEAST WET PREP: NONE SEEN

## 2015-08-22 MED ORDER — METRONIDAZOLE 500 MG PO TABS
2000.0000 mg | ORAL_TABLET | Freq: Once | ORAL | Status: AC
  Administered 2015-08-22: 2000 mg via ORAL
  Filled 2015-08-22: qty 4

## 2015-08-22 MED ORDER — NITROFURANTOIN MONOHYD MACRO 100 MG PO CAPS: 100.0000 mg | ORAL_CAPSULE | Freq: Two times a day (BID) | ORAL | Status: DC

## 2015-08-22 NOTE — ED Notes (Signed)
Pelvic cart set up at beside.

## 2015-08-22 NOTE — ED Notes (Signed)
Pt was recently seen and treated for trichomonas. Now reports small blisters to vagina and increased pain. Also reports increased vaginal discharge. Denies urinary symptoms.

## 2015-08-22 NOTE — ED Notes (Signed)
Pelvic exam performed by ED PA, pt tolerated without difficulty. Cultures sent to lab for testing.
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
## 2015-08-22 NOTE — ED Notes (Signed)
Pt states vaginal discharge and painful blisters to vagina. Was recently treated for trichomonas.

## 2015-08-22 NOTE — Discharge Instructions (Signed)
Urinary Tract Infection Urinary tract infections (UTIs) can develop anywhere along your urinary tract. Your urinary tract is your body's drainage system for removing wastes and extra water. Your urinary tract includes two kidneys, two ureters, a bladder, and a urethra. Your kidneys are a pair of bean-shaped organs. Each kidney is about the size of your fist. They are located below your ribs, one on each side of your spine. CAUSES Infections are caused by microbes, which are microscopic organisms, including fungi, viruses, and bacteria. These organisms are so small that they can only be seen through a microscope. Bacteria are the microbes that most commonly cause UTIs. SYMPTOMS  Symptoms of UTIs may vary by age and gender of the patient and by the location of the infection. Symptoms in young women typically include a frequent and intense urge to urinate and a painful, burning feeling in the bladder or urethra during urination. Older women and men are more likely to be tired, shaky, and weak and have muscle aches and abdominal pain. A fever may mean the infection is in your kidneys. Other symptoms of a kidney infection include pain in your back or sides below the ribs, nausea, and vomiting. DIAGNOSIS To diagnose a UTI, your caregiver will ask you about your symptoms. Your caregiver will also ask you to provide a urine sample. The urine sample will be tested for bacteria and white blood cells. White blood cells are made by your body to help fight infection. TREATMENT  Typically, UTIs can be treated with medication. Because most UTIs are caused by a bacterial infection, they usually can be treated with the use of antibiotics. The choice of antibiotic and length of treatment depend on your symptoms and the type of bacteria causing your infection. HOME CARE INSTRUCTIONS  If you were prescribed antibiotics, take them exactly as your caregiver instructs you. Finish the medication even if you feel better after  you have only taken some of the medication.  Drink enough water and fluids to keep your urine clear or pale yellow.  Avoid caffeine, tea, and carbonated beverages. They tend to irritate your bladder.  Empty your bladder often. Avoid holding urine for long periods of time.  Empty your bladder before and after sexual intercourse.  After a bowel movement, women should cleanse from front to back. Use each tissue only once. SEEK MEDICAL CARE IF:   You have back pain.  You develop a fever.  Your symptoms do not begin to resolve within 3 days. SEEK IMMEDIATE MEDICAL CARE IF:   You have severe back pain or lower abdominal pain.  You develop chills.  You have nausea or vomiting.  You have continued burning or discomfort with urination. MAKE SURE YOU:   Understand these instructions.  Will watch your condition.  Will get help right away if you are not doing well or get worse.   This information is not intended to replace advice given to you by your health care provider. Make sure you discuss any questions you have with your health care provider.   Document Released: 07/23/2005 Document Revised: 07/04/2015 Document Reviewed: 11/21/2011 Elsevier Interactive Patient Education 2016 [employer]. Trichomoniasis Trichomoniasis is an infection caused by an organism called Trichomonas. The infection can affect both women and men. In women, the outer female genitalia and the vagina are affected. In men, the penis is mainly affected, but the prostate and other reproductive organs can also be involved. Trichomoniasis is a sexually transmitted infection (STI) and is most often passed to  another person through sexual contact.  RISK FACTORS  Having unprotected sexual intercourse.  Having sexual intercourse with an infected partner. SIGNS AND SYMPTOMS  Symptoms of trichomoniasis in women include:  Abnormal gray-green frothy vaginal discharge.  Itching and irritation of the  vagina.  Itching and irritation of the area outside the vagina. Symptoms of trichomoniasis in men include:   Penile discharge with or without pain.  Pain during urination. This results from inflammation of the urethra. DIAGNOSIS  Trichomoniasis may be found during a Pap test or physical exam. Your health care provider may use one of the following methods to help diagnose this infection:  Testing the pH of the vagina with a test tape.  Using a vaginal swab test that checks for the Trichomonas organism. A test is available that provides results within a few minutes.  Examining a urine sample.  Testing vaginal secretions. Your health care provider may test you for other STIs, including HIV. TREATMENT   You may be given medicine to fight the infection. Women should inform their health care provider if they could be or are pregnant. Some medicines used to treat the infection should not be taken during pregnancy.  Your health care provider may recommend over-the-counter medicines or creams to decrease itching or irritation.  Your sexual partner will need to be treated if infected.  Your health care provider may test you for infection again 3 months after treatment. HOME CARE INSTRUCTIONS   Take medicines only as directed by your health care provider.  Take over-the-counter medicine for itching or irritation as directed by your health care provider.  Do not have sexual intercourse while you have the infection.  Women should not douche or wear tampons while they have the infection.  Discuss your infection with your partner. Your partner may have gotten the infection from you, or you may have gotten it from your partner.  Have your sex partner get examined and treated if necessary.  Practice safe, informed, and protected sex.  See your health care provider for other STI testing. SEEK MEDICAL CARE IF:   You still have symptoms after you finish your medicine.  You develop  abdominal pain.  You have pain when you urinate.  You have bleeding after sexual intercourse.  You develop a rash.  Your medicine makes you sick or makes you throw up (vomit). MAKE SURE YOU:  Understand these instructions.  Will watch your condition.  Will get help right away if you are not doing well or get worse.   This information is not intended to replace advice given to you by your health care provider. Make sure you discuss any questions you have with your health care provider.   Document Released: 04/08/2001 Document Revised: 11/03/2014 Document Reviewed: 07/25/2013 Elsevier Interactive Patient Education 2016 [employer] Inc[employer] Genital Herpes Genital herpes is a common sexually transmitted infection (STI) that is caused by a virus. The virus is spread from person to person through sexual contact. Infection can cause itching, blisters, and sores in the genital area or rectal area. This is called an outbreak. It affects both men and women. Genital herpes is particularly concerning for pregnant women because the virus can be passed to the baby during delivery and cause serious problems. Genital herpes is also a concern for people with a weakened defense (immune) system. Symptoms of genital herpes may last several days and then go away. However, the virus remains in your body, so you may have more outbreaks of symptoms in the future.  The time between outbreaks varies and can be months or years. CAUSES Genital herpes is caused by a virus called herpes simplex virus (HSV) type 2 or HSV type 1. These viruses are contagious and are most often spread through sexual contact with an infected person. Sexual contact includes vaginal, anal, and oral sex. RISK FACTORS Risk factors for genital herpes include:  Being sexually active with multiple partners.  Having unprotected sex. SIGNS AND SYMPTOMS Symptoms may include:  Pain and itching in the genital area or rectal area.  Small red bumps  that turn into blisters and then turn into sores.  Flu-like symptoms, including:  Fever.  Body aches.  Painful urination.  Vaginal discharge. DIAGNOSIS Genital herpes may be diagnosed by:  Physical exam.  Blood test.  Fluid culture test from an open sore. TREATMENT There is no cure for genital herpes. Oral antiviral medicines may be used to speed up healing and to help prevent the return of symptoms. These medicines can also help to reduce the spread of the virus to sexual partners. HOME CARE INSTRUCTIONS  Keep the affected areas dry and clean.  Take medicines only as directed by your health care provider.  Do not have sexual contact during active infections. Genital herpes is contagious.  Practice safe sex. Latex condoms and female condoms may help to prevent the spread of the herpes virus.  Avoid rubbing or touching the blisters and sores. If you do touch the blister or sores:  Wash your hands thoroughly.  Do not touch your eyes afterward.  If you become pregnant, tell your health care provider if you have had genital herpes.  Keep all follow-up visits as directed by your health care provider. This is important. PREVENTION  Use condoms. Although anyone can contract genital herpes during sexual contact even with the use of a condom, a condom can provide some protection.  Avoid having multiple sexual partners.  Talk to your sexual partner about any symptoms and past history that either of you may have.  Get tested before you have sex. Ask your partner to do the same.  Recognize the symptoms of genital herpes. Do not have sexual contact if you notice these symptoms. SEEK MEDICAL CARE IF:  Your symptoms are not improving with medicine.  Your symptoms return.  You have new symptoms.  You have a fever.  You have abdominal pain.  You have redness, swelling, or pain in your eye. MAKE SURE YOU:  Understand these instructions.  Will watch your  condition.  Will get help right away if you are not doing well or get worse.   This information is not intended to replace advice given to you by your health care provider. Make sure you discuss any questions you have with your health care provider.   Document Released: 10/10/2000 Document Revised: 11/03/2014 Document Reviewed: 02/28/2014 Elsevier Interactive Patient Education 2016 [employer] First Trimester of Pregnancy The first trimester of pregnancy is from week 1 until the end of week 12 (months 1 through 3). A week after a sperm fertilizes an egg, the egg will implant on the wall of the uterus. This embryo will begin to develop into a baby. Genes from you and your partner are forming the baby. The female genes determine whether the baby is a boy or a girl. At 6-8 weeks, the eyes and face are formed, and the heartbeat can be seen on ultrasound. At the end of 12 weeks, all the baby's organs are formed.  Now that you  are pregnant, you will want to do everything you can to have a healthy baby. Two of the most important things are to get good prenatal care and to follow your health care provider's instructions. Prenatal care is all the medical care you receive before the baby's birth. This care will help prevent, find, and treat any problems during the pregnancy and childbirth. BODY CHANGES Your body goes through many changes during pregnancy. The changes vary from woman to woman.   You may gain or lose a couple of pounds at first.  You may feel sick to your stomach (nauseous) and throw up (vomit). If the vomiting is uncontrollable, call your health care provider.  You may tire easily.  You may develop headaches that can be relieved by medicines approved by your health care provider.  You may urinate more often. Painful urination may mean you have a bladder infection.  You may develop heartburn as a result of your pregnancy.  You may develop constipation because certain hormones are  causing the muscles that push waste through your intestines to slow down.  You may develop hemorrhoids or swollen, bulging veins (varicose veins).  Your breasts may begin to grow larger and become tender. Your nipples may stick out more, and the tissue that surrounds them (areola) may become darker.  Your gums may bleed and may be sensitive to brushing and flossing.  Dark spots or blotches (chloasma, mask of pregnancy) may develop on your face. This will likely fade after the baby is born.  Your menstrual periods will stop.  You may have a loss of appetite.  You may develop cravings for certain kinds of food.  You may have changes in your emotions from day to day, such as being excited to be pregnant or being concerned that something may go wrong with the pregnancy and baby.  You may have more vivid and strange dreams.  You may have changes in your hair. These can include thickening of your hair, rapid growth, and changes in texture. Some women also have hair loss during or after pregnancy, or hair that feels dry or thin. Your hair will most likely return to normal after your baby is born. WHAT TO EXPECT AT YOUR PRENATAL VISITS During a routine prenatal visit:  You will be weighed to make sure you and the baby are growing normally.  Your blood pressure will be taken.  Your abdomen will be measured to track your baby's growth.  The fetal heartbeat will be listened to starting around week 10 or 12 of your pregnancy.  Test results from any previous visits will be discussed. Your health care provider may ask you:  How you are feeling.  If you are feeling the baby move.  If you have had any abnormal symptoms, such as leaking fluid, bleeding, severe headaches, or abdominal cramping.  If you are using any tobacco products, including cigarettes, chewing tobacco, and electronic cigarettes.  If you have any questions. Other tests that may be performed during your first trimester  include:  Blood tests to find your blood type and to check for the presence of any previous infections. They will also be used to check for low iron levels (anemia) and Rh antibodies. Later in the pregnancy, blood tests for diabetes will be done along with other tests if problems develop.  Urine tests to check for infections, diabetes, or protein in the urine.  An ultrasound to confirm the proper growth and development of the baby.  An amniocentesis to  check for possible genetic problems.  Fetal screens for spina bifida and Down syndrome.  You may need other tests to make sure you and the baby are doing well.  HIV (human immunodeficiency virus) testing. Routine prenatal testing includes screening for HIV, unless you choose not to have this test. HOME CARE INSTRUCTIONS  Medicines  Follow your health care provider's instructions regarding medicine use. Specific medicines may be either safe or unsafe to take during pregnancy.  Take your prenatal vitamins as directed.  If you develop constipation, try taking a stool softener if your health care provider approves. Diet  Eat regular, well-balanced meals. Choose a variety of foods, such as meat or vegetable-based protein, fish, milk and low-fat dairy products, vegetables, fruits, and whole grain breads and cereals. Your health care provider will help you determine the amount of weight gain that is right for you.  Avoid raw meat and uncooked cheese. These carry germs that can cause birth defects in the baby.  Eating four or five small meals rather than three large meals a day may help relieve nausea and vomiting. If you start to feel nauseous, eating a few soda crackers can be helpful. Drinking liquids between meals instead of during meals also seems to help nausea and vomiting.  If you develop constipation, eat more high-fiber foods, such as fresh vegetables or fruit and whole grains. Drink enough fluids to keep your urine clear or pale  yellow. Activity and Exercise  Exercise only as directed by your health care provider. Exercising will help you:  Control your weight.  Stay in shape.  Be prepared for labor and delivery.  Experiencing pain or cramping in the lower abdomen or low back is a good sign that you should stop exercising. Check with your health care provider before continuing normal exercises.  Try to avoid standing for long periods of time. Move your legs often if you must stand in one place for a long time.  Avoid heavy lifting.  Wear low-heeled shoes, and practice good posture.  You may continue to have sex unless your health care provider directs you otherwise. Relief of Pain or Discomfort  Wear a good support bra for breast tenderness.   Take warm sitz baths to soothe any pain or discomfort caused by hemorrhoids. Use hemorrhoid cream if your health care provider approves.   Rest with your legs elevated if you have leg cramps or low back pain.  If you develop varicose veins in your legs, wear support hose. Elevate your feet for 15 minutes, 3-4 times a day. Limit salt in your diet. Prenatal Care  Schedule your prenatal visits by the twelfth week of pregnancy. They are usually scheduled monthly at first, then more often in the last 2 months before delivery.  Write down your questions. Take them to your prenatal visits.  Keep all your prenatal visits as directed by your health care provider. Safety  Wear your seat belt at all times when driving.  Make a list of emergency phone numbers, including numbers for family, friends, the hospital, and police and fire departments. General Tips  Ask your health care provider for a referral to a local prenatal education class. Begin classes no later than at the beginning of month 6 of your pregnancy.  Ask for help if you have counseling or nutritional needs during pregnancy. Your health care provider can offer advice or refer you to specialists for help  with various needs.  Do not use hot tubs, steam rooms, or saunas.  Do not douche or use tampons or scented sanitary pads.  Do not cross your legs for long periods of time.  Avoid cat litter boxes and soil used by cats. These carry germs that can cause birth defects in the baby and possibly loss of the fetus by miscarriage or stillbirth.  Avoid all smoking, herbs, alcohol, and medicines not prescribed by your health care provider. Chemicals in these affect the formation and growth of the baby.  Do not use any tobacco products, including cigarettes, chewing tobacco, and electronic cigarettes. If you need help quitting, ask your health care provider. You may receive counseling support and other resources to help you quit.  Schedule a dentist appointment. At home, brush your teeth with a soft toothbrush and be gentle when you floss. SEEK MEDICAL CARE IF:   You have dizziness.  You have mild pelvic cramps, pelvic pressure, or nagging pain in the abdominal area.  You have persistent nausea, vomiting, or diarrhea.  You have a bad smelling vaginal discharge.  You have pain with urination.  You notice increased swelling in your face, hands, legs, or ankles. SEEK IMMEDIATE MEDICAL CARE IF:   You have a fever.  You are leaking fluid from your vagina.  You have spotting or bleeding from your vagina.  You have severe abdominal cramping or pain.  You have rapid weight gain or loss.  You vomit blood or material that looks like coffee grounds.  You are exposed to [place] measles and have never had them.  You are exposed to fifth disease or chickenpox.  You develop a severe headache.  You have shortness of breath.  You have any kind of trauma, such as from a fall or a car accident.   This information is not intended to replace advice given to you by your health care provider. Make sure you discuss any questions you have with your health care provider.   Document Released:  10/07/2001 Document Revised: 11/03/2014 Document Reviewed: 08/23/2013 Elsevier Interactive Patient Education [employer]

## 2015-08-23 LAB — GC/CHLAMYDIA PROBE AMP (~~LOC~~) NOT AT ARMC
CHLAMYDIA, DNA PROBE: NEGATIVE
NEISSERIA GONORRHEA: NEGATIVE

## 2015-08-23 LAB — SYPHILIS: RPR W/REFLEX TO RPR TITER AND TREPONEMAL ANTIBODIES, TRADITIONAL SCREENING AND DIAGNOSIS ALGORITHM: RPR Ser Ql: NONREACTIVE

## 2015-08-24 LAB — URINE CULTURE

## 2015-09-03 ENCOUNTER — Inpatient Hospital Stay (HOSPITAL_COMMUNITY)
Admission: AD | Admit: 2015-09-03 | Discharge: 2015-09-03 | Disposition: A | Discharge status: Home / Self Care | Payer: Medicaid Other | Source: Ambulatory Visit | Attending: Family Medicine | Admitting: Family Medicine

## 2015-09-03 ENCOUNTER — Inpatient Hospital Stay (HOSPITAL_COMMUNITY): Payer: Medicaid Other

## 2015-09-03 ENCOUNTER — Encounter (HOSPITAL_COMMUNITY): Payer: Self-pay | Admitting: *Deleted

## 2015-09-03 DIAGNOSIS — O9989 Other specified diseases and conditions complicating pregnancy, childbirth and the puerperium: Secondary | ICD-10-CM

## 2015-09-03 DIAGNOSIS — O26899 Other specified pregnancy related conditions, unspecified trimester: Secondary | ICD-10-CM

## 2015-09-03 DIAGNOSIS — R109 Unspecified abdominal pain: Secondary | ICD-10-CM | POA: Insufficient documentation

## 2015-09-03 DIAGNOSIS — Z3A01 Less than 8 weeks gestation of pregnancy: Secondary | ICD-10-CM | POA: Insufficient documentation

## 2015-09-03 DIAGNOSIS — O26891 Other specified pregnancy related conditions, first trimester: Secondary | ICD-10-CM | POA: Insufficient documentation

## 2015-09-03 HISTORY — DX: Calculus of kidney: N20.0

## 2015-09-03 LAB — URINE MICROSCOPIC-ADD ON

## 2015-09-03 LAB — URINALYSIS, ROUTINE W REFLEX MICROSCOPIC
BILIRUBIN URINE: NEGATIVE
GLUCOSE, UA: NEGATIVE mg/dL
Hgb urine dipstick: NEGATIVE
Ketones, ur: NEGATIVE mg/dL
NITRITE: NEGATIVE
PH: 6 (ref 5.0–8.0)
Protein, ur: NEGATIVE mg/dL
Urobilinogen, UA: 0.2 mg/dL (ref 0.0–1.0)

## 2015-09-03 MED ORDER — PRENATAL COMPLETE 14-0.4 MG PO TABS: 1.0000 | ORAL_TABLET | Freq: Every day | ORAL | Status: DC
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
## 2015-09-03 NOTE — MAU Note (Signed)
Patient presents with a +HPT and c/o abdominal pain X 1 week. Denies bleeding but states her discharge is "a little more than normal".

## 2015-09-03 NOTE — Discharge Instructions (Signed)
Prenatal Care °WHAT IS PRENATAL CARE?  °Prenatal care is the process of caring for a pregnant woman before she gives birth. Prenatal care makes sure that she and her baby remain as healthy as possible throughout pregnancy. Prenatal care may be provided by a midwife, family practice health care provider, or a childbirth and pregnancy specialist (obstetrician). Prenatal care may include physical examinations, testing, treatments, and education on nutrition, lifestyle, and social support services. °WHY IS PRENATAL CARE SO IMPORTANT?  °Early and consistent prenatal care increases the chance that you and your baby will remain healthy throughout your pregnancy. This type of care also decreases a baby's risk of being born too early (prematurely), or being born smaller than expected (small for gestational age). Any underlying medical conditions you may have that could pose a risk during your pregnancy are discussed during prenatal care visits. You will also be monitored regularly for any new conditions that may arise during your pregnancy so they can be treated quickly and effectively. °WHAT HAPPENS DURING PRENATAL CARE VISITS? °Prenatal care visits may include the following: °Discussion °Tell your health care provider about any new signs or symptoms you have experienced since your last visit. These might include: °· Nausea or vomiting. °· Increased or decreased level of energy. °· Difficulty sleeping. °· Back or leg pain. °· Weight changes. °· Frequent urination. °· Shortness of breath with physical activity. °· Changes in your skin, such as the development of a rash or itchiness. °· Vaginal discharge or bleeding. °· Feelings of excitement or nervousness. °· Changes in your baby's movements. °You may want to write down any questions or topics you want to discuss with your health care provider and bring them with you to your appointment. °Examination °During your first prenatal care visit, you will likely have a complete  physical exam. Your health care provider will often examine your vagina, cervix, and the position of your uterus, as well as check your heart, lungs, and other body systems. As your pregnancy progresses, your health care provider will measure the size of your uterus and your baby's position inside your uterus. He or she may also examine you for early signs of labor. Your prenatal visits may also include checking your blood pressure and, after about 10-12 weeks of pregnancy, listening to your baby's heartbeat. °Testing °Regular testing often includes: °· Urinalysis. This checks your urine for glucose, protein, or signs of infection. °· Blood count. This checks the levels of white and red blood cells in your body. °· Tests for sexually transmitted infections (STIs). Testing for STIs at the beginning of pregnancy is routinely done and is required in many states. °· Antibody testing. You will be checked to see if you are immune to certain illnesses, such as rubella, that can affect a developing fetus. °· Glucose screen. Around 24-28 weeks of pregnancy, your blood glucose level will be checked for signs of gestational diabetes. Follow-up tests may be recommended. °· Group B strep. This is a bacteria that is commonly found inside a woman's vagina. This test will inform your health care provider if you need an antibiotic to reduce the amount of this bacteria in your body prior to labor and childbirth. °· Ultrasound. Many pregnant women undergo an ultrasound screening around 18-20 weeks of pregnancy to evaluate the health of the fetus and check for any developmental abnormalities. °· HIV (human immunodeficiency virus) testing. Early in your pregnancy, you will be screened for HIV. If you are at high risk for HIV, this test   may be repeated during your third trimester of pregnancy. °You may be offered other testing based on your age, personal or family medical history, or other factors.  °HOW OFTEN SHOULD I PLAN TO SEE MY  HEALTH CARE PROVIDER FOR PRENATAL CARE? °Your prenatal care check-up schedule depends on any medical conditions you have before, or develop during, your pregnancy. If you do not have any underlying medical conditions, you will likely be seen for checkups: °· Monthly, during the first 6 months of pregnancy. °· Twice a month during months 7 and 8 of pregnancy. °· Weekly starting in the 9th month of pregnancy and until delivery. °If you develop signs of early labor or other concerning signs or symptoms, you may need to see your health care provider more often. Ask your health care provider what prenatal care schedule is best for you. °WHAT CAN I DO TO KEEP MYSELF AND MY BABY AS HEALTHY AS POSSIBLE DURING MY PREGNANCY? °· Take a prenatal vitamin containing 400 micrograms (0.4 mg) of folic acid every day. Your health care provider may also ask you to take additional vitamins such as iodine, vitamin D, iron, copper, and zinc. °· Take 1500-2000 mg of calcium daily starting at your 20th week of pregnancy until you deliver your baby. °· Make sure you are up to date on your vaccinations. Unless directed otherwise by your health care provider: °¨ You should receive a tetanus, diphtheria, and pertussis (Tdap) vaccination between the 27th and 36th week of your pregnancy, regardless of when your last Tdap immunization occurred. This helps protect your baby from whooping cough (pertussis) after he or she is born. °¨ You should receive an annual inactivated influenza vaccine (IIV) to help protect you and your baby from influenza. This can be done at any point during your pregnancy. °· Eat a well-rounded diet that includes: °¨ Fresh fruits and vegetables. °¨ Lean proteins. °¨ Calcium-rich foods such as milk, yogurt, hard cheeses, and dark, leafy greens. °¨ Whole grain breads. °· Do not eat seafood high in mercury, including: °¨ Swordfish. °¨ Tilefish. °¨ Shark. °¨ King mackerel. °¨ More than 6 oz tuna per week. °· Do not eat: °¨ Raw  or undercooked meats or eggs. °¨ Unpasteurized foods, such as soft cheeses (brie, blue, or feta), juices, and milks. °¨ Lunch meats. °¨ Hot dogs that have not been heated until they are steaming. °· Drink enough water to keep your urine clear or pale yellow. For many women, this may be 10 or more 8 oz glasses of water each day. Keeping yourself hydrated helps deliver nutrients to your baby and may prevent the start of pre-term uterine contractions. °· Do not use any tobacco products including cigarettes, chewing tobacco, or electronic cigarettes. If you need help quitting, ask your health care provider. °· Do not drink beverages containing alcohol. No safe level of alcohol consumption during pregnancy has been determined. °· Do not use any illegal drugs. These can harm your developing baby or cause a miscarriage. °· Ask your health care provider or pharmacist before taking any prescription or over-the-counter medicines, herbs, or supplements. °· Limit your caffeine intake to no more than 200 mg per day. °· Exercise. Unless told otherwise by your health care provider, try to get 30 minutes of moderate exercise most days of the week. Do not  do high-impact activities, contact sports, or activities with a high risk of falling, such as horseback riding or downhill skiing. °· Get plenty of rest. °· Avoid anything that raises your   body temperature, such as hot tubs and saunas. °· If you own a cat, do not empty its litter box. Bacteria contained in cat feces can cause an infection called toxoplasmosis. This can result in serious harm to the fetus. °· Stay away from chemicals such as insecticides, lead, mercury, and cleaning or paint products that contain solvents. °· Do not have any X-rays taken unless medically necessary. °· Take a childbirth and breastfeeding preparation class. Ask your health care provider if you need a referral or recommendation. °  °This information is not intended to replace advice given to you by  your health care provider. Make sure you discuss any questions you have with your health care provider. °  °Document Released: 10/16/2003 Document Revised: 11/03/2014 Document Reviewed: 12/28/2013 °Elsevier Interactive Patient Education ©2016 [employer] ° °

## 2015-09-13 ENCOUNTER — Encounter (HOSPITAL_COMMUNITY): Payer: Self-pay | Admitting: Emergency Medicine

## 2015-09-13 ENCOUNTER — Emergency Department (HOSPITAL_COMMUNITY)
Admission: EM | Admit: 2015-09-13 | Discharge: 2015-09-13 | Disposition: A | Discharge status: Home / Self Care | Payer: Medicaid Other | Attending: Emergency Medicine | Admitting: Emergency Medicine

## 2015-09-13 DIAGNOSIS — Z880 Allergy status to penicillin: Secondary | ICD-10-CM | POA: Insufficient documentation

## 2015-09-13 DIAGNOSIS — Z79899 Other long term (current) drug therapy: Secondary | ICD-10-CM | POA: Insufficient documentation

## 2015-09-13 DIAGNOSIS — Z87891 Personal history of nicotine dependence: Secondary | ICD-10-CM | POA: Insufficient documentation

## 2015-09-13 DIAGNOSIS — A5900 Urogenital trichomoniasis, unspecified: Secondary | ICD-10-CM | POA: Diagnosis not present

## 2015-09-13 DIAGNOSIS — O98811 Other maternal infectious and parasitic diseases complicating pregnancy, first trimester: Secondary | ICD-10-CM | POA: Insufficient documentation

## 2015-09-13 DIAGNOSIS — Z87442 Personal history of urinary calculi: Secondary | ICD-10-CM | POA: Diagnosis not present

## 2015-09-13 DIAGNOSIS — A599 Trichomoniasis, unspecified: Secondary | ICD-10-CM

## 2015-09-13 DIAGNOSIS — O99011 Anemia complicating pregnancy, first trimester: Secondary | ICD-10-CM | POA: Insufficient documentation

## 2015-09-13 DIAGNOSIS — O9989 Other specified diseases and conditions complicating pregnancy, childbirth and the puerperium: Secondary | ICD-10-CM | POA: Diagnosis present

## 2015-09-13 DIAGNOSIS — D649 Anemia, unspecified: Secondary | ICD-10-CM | POA: Insufficient documentation

## 2015-09-13 DIAGNOSIS — Z3A01 Less than 8 weeks gestation of pregnancy: Secondary | ICD-10-CM | POA: Diagnosis not present

## 2015-09-13 DIAGNOSIS — Z87448 Personal history of other diseases of urinary system: Secondary | ICD-10-CM | POA: Diagnosis not present

## 2015-09-13 DIAGNOSIS — Z91040 Latex allergy status: Secondary | ICD-10-CM | POA: Diagnosis not present

## 2015-09-13 LAB — CBC
HEMATOCRIT: 36.1 % (ref 36.0–46.0)
HEMOGLOBIN: 11.7 g/dL — AB (ref 12.0–15.0)
MCH: 28.1 pg (ref 26.0–34.0)
MCHC: 32.4 g/dL (ref 30.0–36.0)
MCV: 86.8 fL (ref 78.0–100.0)
Platelets: 232 10*3/uL (ref 150–400)
RBC: 4.16 MIL/uL (ref 3.87–5.11)
RDW: 13.4 % (ref 11.5–15.5)
WBC: 10.9 10*3/uL — ABNORMAL HIGH (ref 4.0–10.5)

## 2015-09-13 LAB — COMPREHENSIVE METABOLIC PANEL
ALBUMIN: 3.7 g/dL (ref 3.5–5.0)
ALK PHOS: 38 U/L (ref 38–126)
ALT: 10 U/L — ABNORMAL LOW (ref 14–54)
ANION GAP: 6 (ref 5–15)
AST: 12 U/L — AB (ref 15–41)
BILIRUBIN TOTAL: 0.6 mg/dL (ref 0.3–1.2)
BUN: 8 mg/dL (ref 6–20)
CHLORIDE: 105 mmol/L (ref 101–111)
CO2: 24 mmol/L (ref 22–32)
Calcium: 8.7 mg/dL — ABNORMAL LOW (ref 8.9–10.3)
Creatinine, Ser: 0.63 mg/dL (ref 0.44–1.00)
GFR calc Af Amer: >60 mL/min (ref >60)
GFR calc non Af Amer: >60 mL/min (ref >60)
GLUCOSE: 86 mg/dL (ref 65–99)
POTASSIUM: 3.9 mmol/L (ref 3.5–5.1)
Sodium: 135 mmol/L (ref 135–145)
Total Protein: 6.8 g/dL (ref 6.5–8.1)

## 2015-09-13 LAB — WET PREP, GENITAL
Sperm: NONE SEEN
Yeast Wet Prep HPF POC: NONE SEEN

## 2015-09-13 LAB — URINE MICROSCOPIC-ADD ON

## 2015-09-13 LAB — URINALYSIS, ROUTINE W REFLEX MICROSCOPIC
BILIRUBIN URINE: NEGATIVE
GLUCOSE, UA: NEGATIVE mg/dL
Hgb urine dipstick: NEGATIVE
KETONES UR: NEGATIVE mg/dL
NITRITE: NEGATIVE
PH: 7.5 (ref 5.0–8.0)
Protein, ur: NEGATIVE mg/dL
SPECIFIC GRAVITY, URINE: 1.023 (ref 1.005–1.030)

## 2015-09-13 LAB — HCG, QUANTITATIVE, PREGNANCY: hCG, Beta Chain, Quant, S: 65614 m[IU]/mL — ABNORMAL HIGH (ref <5)

## 2015-09-13 MED ORDER — METRONIDAZOLE 500 MG PO TABS: 500.0000 mg | ORAL_TABLET | Freq: Once | ORAL | Status: DC

## 2015-09-13 MED ORDER — METRONIDAZOLE 500 MG PO TABS
2000.0000 mg | ORAL_TABLET | Freq: Once | ORAL | Status: AC
  Administered 2015-09-13: 2000 mg via ORAL
  Filled 2015-09-13: qty 4

## 2015-09-13 MED ORDER — AZITHROMYCIN 250 MG PO TABS: 2000.0000 mg | ORAL_TABLET | Freq: Once | ORAL | Status: DC

## 2015-09-13 MED ORDER — AZITHROMYCIN 250 MG PO TABS: 1000.0000 mg | ORAL_TABLET | Freq: Once | ORAL | Status: DC

## 2015-09-13 NOTE — ED Notes (Signed)
Pt states intermittent lower abdominal cramping, states she's [redacted] weeks pregnant. Having yellow/green discharge from vaginal area and she wants to be examined for Trich. Denies N/V/D, fever/chills.

## 2015-09-13 NOTE — Discharge Instructions (Signed)
Trichomoniasis °Trichomoniasis is an infection caused by an organism called Trichomonas. The infection can affect both women and men. In women, the outer female genitalia and the vagina are affected. In men, the penis is mainly affected, but the prostate and other reproductive organs can also be involved. Trichomoniasis is a sexually transmitted infection (STI) and is most often passed to another person through sexual contact.  °RISK FACTORS °· Having unprotected sexual intercourse. °· Having sexual intercourse with an infected partner. °SIGNS AND SYMPTOMS  °Symptoms of trichomoniasis in women include: °· Abnormal gray-green frothy vaginal discharge. °· Itching and irritation of the vagina. °· Itching and irritation of the area outside the vagina. °Symptoms of trichomoniasis in men include:  °· Penile discharge with or without pain. °· Pain during urination. This results from inflammation of the urethra. °DIAGNOSIS  °Trichomoniasis may be found during a Pap test or physical exam. Your health care provider may use one of the following methods to help diagnose this infection: °· Testing the pH of the vagina with a test tape. °· Using a vaginal swab test that checks for the Trichomonas organism. A test is available that provides results within a few minutes. °· Examining a urine sample. °· Testing vaginal secretions. °Your health care provider may test you for other STIs, including HIV. °TREATMENT  °· You may be given medicine to fight the infection. Women should inform their health care provider if they could be or are pregnant. Some medicines used to treat the infection should not be taken during pregnancy. °· Your health care provider may recommend over-the-counter medicines or creams to decrease itching or irritation. °· Your sexual partner will need to be treated if infected. °· Your health care provider may test you for infection again 3 months after treatment. °HOME CARE INSTRUCTIONS  °· Take medicines only as  directed by your health care provider. °· Take over-the-counter medicine for itching or irritation as directed by your health care provider. °· Do not have sexual intercourse while you have the infection. °· Women should not douche or wear tampons while they have the infection. °· Discuss your infection with your partner. Your partner may have gotten the infection from you, or you may have gotten it from your partner. °· Have your sex partner get examined and treated if necessary. °· Practice safe, informed, and protected sex. °· See your health care provider for other STI testing. °SEEK MEDICAL CARE IF:  °· You still have symptoms after you finish your medicine. °· You develop abdominal pain. °· You have pain when you urinate. °· You have bleeding after sexual intercourse. °· You develop a rash. °· Your medicine makes you sick or makes you throw up (vomit). °MAKE SURE YOU: °· Understand these instructions. °· Will watch your condition. °· Will get help right away if you are not doing well or get worse. °  °This information is not intended to replace advice given to you by your health care provider. Make sure you discuss any questions you have with your health care provider. °  °Document Released: 04/08/2001 Document Revised: 11/03/2014 Document Reviewed: 07/25/2013 °Elsevier Interactive Patient Education ©2016 [employer] ° ° °Sexually Transmitted Disease °A sexually transmitted disease (STD) is a disease or infection often passed to another person during sex. However, STDs can be passed through nonsexual ways. An STD can be passed through: °· Spit (saliva). °· Semen. °· Blood. °· Mucus from the vagina. °· Pee (urine). °HOW CAN I LESSEN MY CHANCES OF GETTING AN   STD? °· Use: °¨ Latex condoms. °¨ Water-soluble lubricants with condoms. Do not use petroleum jelly or oils. °¨ Dental dams. These are small pieces of latex that are used as a barrier during oral sex. °· Avoid having more than one sex partner. °· Do not  have sex with someone who has other sex partners. °· Do not have sex with anyone you do not know or who is at high risk for an STD. °· Avoid risky sex that can break your skin. °· Do not have sex if you have open sores on your mouth or skin. °· Avoid drinking too much alcohol or taking illegal drugs. Alcohol and drugs can affect your good judgment. °· Avoid oral and anal sex acts. °· Get shots (vaccines) for HPV and hepatitis. °· If you are at risk of being infected with HIV, it is advised that you take a certain medicine daily to prevent HIV infection. This is called pre-exposure prophylaxis (PrEP). You may be at risk if: °¨ You are a man who has sex with other men (MSM). °¨ You are attracted to the opposite sex (heterosexual) and are having sex with more than one partner. °¨ You take drugs with a needle. °¨ You have sex with someone who has HIV. °· Talk with your doctor about if you are at high risk of being infected with HIV. If you begin to take PrEP, get tested for HIV first. Get tested every 3 months for as long as you are taking PrEP. °· Get tested for STDs every year if you are sexually active. If you are treated for an STD, get tested again 3 months after you are treated. °WHAT SHOULD I DO IF I THINK I HAVE AN STD? °· See your doctor. °· Tell your sex partner(s) that you have an STD. They should be tested and treated. °· Do not have sex until your doctor says it is okay. °WHEN SHOULD I GET HELP? °Get help right away if: °· You have bad belly (abdominal) pain. °· You are a man and have puffiness (swelling) or pain in your testicles. °· You are a woman and have puffiness in your vagina. °  °This information is not intended to replace advice given to you by your health care provider. Make sure you discuss any questions you have with your health care provider. °  °Document Released: 11/20/2004 Document Revised: 11/03/2014 Document Reviewed: 04/08/2013 °Elsevier Interactive Patient Education ©2016 [employer]  Inc[employer] ° °
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
## 2015-09-14 LAB — GC/CHLAMYDIA PROBE AMP (~~LOC~~) NOT AT ARMC
Chlamydia: NEGATIVE
NEISSERIA GONORRHEA: NEGATIVE

## 2015-10-02 LAB — OB RESULTS CONSOLE RPR: RPR: NONREACTIVE

## 2015-10-02 LAB — OB RESULTS CONSOLE ABO/RH: RH Type: POSITIVE

## 2015-10-02 LAB — OB RESULTS CONSOLE GC/CHLAMYDIA
CHLAMYDIA, DNA PROBE: NEGATIVE
GC PROBE AMP, GENITAL: NEGATIVE

## 2015-10-02 LAB — OB RESULTS CONSOLE ANTIBODY SCREEN: ANTIBODY SCREEN: NEGATIVE

## 2015-10-02 LAB — OB RESULTS CONSOLE HIV ANTIBODY (ROUTINE TESTING): HIV: NONREACTIVE

## 2015-10-02 LAB — OB RESULTS CONSOLE RUBELLA ANTIBODY, IGM: RUBELLA: IMMUNE

## 2015-10-02 LAB — OB RESULTS CONSOLE HEPATITIS B SURFACE ANTIGEN: Hepatitis B Surface Ag: NEGATIVE

## 2015-10-11 ENCOUNTER — Other Ambulatory Visit (HOSPITAL_COMMUNITY): Payer: Self-pay | Admitting: Obstetrics and Gynecology

## 2015-10-11 DIAGNOSIS — O283 Abnormal ultrasonic finding on antenatal screening of mother: Secondary | ICD-10-CM

## 2015-10-11 DIAGNOSIS — Z3A10 10 weeks gestation of pregnancy: Secondary | ICD-10-CM

## 2015-10-17 ENCOUNTER — Encounter (HOSPITAL_COMMUNITY): Payer: Self-pay

## 2015-10-17 ENCOUNTER — Ambulatory Visit (HOSPITAL_COMMUNITY)
Admission: RE | Admit: 2015-10-17 | Discharge: 2015-10-17 | Disposition: A | Discharge status: Home / Self Care | Payer: Medicaid Other | Source: Ambulatory Visit | Attending: Obstetrics and Gynecology | Admitting: Obstetrics and Gynecology

## 2015-10-17 ENCOUNTER — Other Ambulatory Visit (HOSPITAL_COMMUNITY): Payer: Self-pay | Admitting: Obstetrics and Gynecology

## 2015-10-17 ENCOUNTER — Inpatient Hospital Stay (HOSPITAL_COMMUNITY): Admission: RE | Admit: 2015-10-17 | Payer: Medicaid Other | Source: Ambulatory Visit

## 2015-10-17 DIAGNOSIS — Z315 Encounter for genetic counseling: Secondary | ICD-10-CM | POA: Diagnosis present

## 2015-10-17 DIAGNOSIS — O283 Abnormal ultrasonic finding on antenatal screening of mother: Secondary | ICD-10-CM

## 2015-10-17 DIAGNOSIS — O09891 Supervision of other high risk pregnancies, first trimester: Secondary | ICD-10-CM

## 2015-10-17 DIAGNOSIS — O09211 Supervision of pregnancy with history of pre-term labor, first trimester: Secondary | ICD-10-CM

## 2015-10-17 DIAGNOSIS — Z8279 Family history of other congenital malformations, deformations and chromosomal abnormalities: Secondary | ICD-10-CM

## 2015-10-17 DIAGNOSIS — O34219 Maternal care for unspecified type scar from previous cesarean delivery: Secondary | ICD-10-CM

## 2015-10-17 DIAGNOSIS — Z3A11 11 weeks gestation of pregnancy: Secondary | ICD-10-CM | POA: Insufficient documentation

## 2015-10-17 DIAGNOSIS — O352XX0 Maternal care for (suspected) hereditary disease in fetus, not applicable or unspecified: Secondary | ICD-10-CM

## 2015-10-17 DIAGNOSIS — Z3A10 10 weeks gestation of pregnancy: Secondary | ICD-10-CM

## 2015-10-17 NOTE — Progress Notes (Signed)
Genetic Counseling  Visit Summary Note  Appointment Date: 10/17/2015 Referred By: Donnel Saxon, CNM  Date of Birth: 16-Apr-1986  Pregnancy history: [id number] Estimated Date of Delivery: 05/04/16 Estimated Gestational Age: [redacted]w[redacted]d I met with Ms. ASmiley Housemanand her partner, TBeau Fanny for genetic counseling because of a previous pregnancy with an omphalocele and lower extremity abnormalities.  She was initially referred because of concern for an abdominal wall defect in her current pregnancy, but ultrasound did not identify an abnormality today.  In summary:  Discussed family history of amniotic band syndrome  Discussed low risk of recurrence  Offered screening for fetal aneuploidy today and patient deferred until next ultrasound.  We began by reviewing the family history in detail.  Ms. WRudareported that she had a pregnancy several years ago which was identified, prenatally, to have an omphalocele and lower extremity abnormalities.  She underwent amniocentesis during that pregnancy, but does not recall the results of the procedure.  The baby, a boy, was born at approximately 341 weeks(by her recollection) and died after 30 minutes of life.  She stated that an autopsy was not performed due to the cost.  She did not recall any more information about the pregnancy or his health concerns.  Medical records for that pregnancy were not available for review at the time of our appointment.  Ms. WJohannstated that she recalls being told that her son had amniotic bands.  There was no other family history of any type of similar condition, and the father of that pregnancy is different from the father of this pregnancy.    We discussed that amniotic bands are the result of a prenatal rupture of the amnion, and amniotic band syndrome can have disruptive effects on fetal anatomy which are often asymmetric and highly variable. Facial clefts, clubfoot, and effects on other extremities, such as hands can all be  seen as a result of amniotic band syndrome. Typically, occurrence is sporadic and not associated with underlying aneuploidy, though very rare cases of associations with heritable disorders have been reported. Given the reported family history, recurrence risk for amniotic band syndrome in the current pregnancy is likely low. She was relieved to hear this.   If her son's chromosomes were normal, as she believes they were, than the chance for her to have a child with a chromosome condition would not be expected to be any greater than the general population chance.  However, due to her concerns, she was offered the option of screening for fetal aneuploidy today and she declined.  She stated that she would wait until her next ultrasound at our office in 7 weeks and then decide based on that.  The family histories were otherwise found to be noncontributory for birth defects, mental retardation, and known genetic conditions. Without further information regarding the provided family history, an accurate genetic risk cannot be calculated. Further genetic counseling is warranted if more information is obtained.  Ms. WHollonwas provided with written information regarding sickle cell anemia (SCA) including the carrier frequency and incidence in the African-American population, the availability of carrier testing and prenatal diagnosis if indicated.  In addition, we discussed that hemoglobinopathies are routinely screened for as part of the [place] newborn screening panel.  She was counseled that hemoglobin electrophoresis was previously performed at her physician's office and found to be within normal limits.   Ms. WWestergaarddenied exposure to environmental toxins or chemical agents. She denied the use of alcohol, tobacco or street drugs.  She denied significant viral illnesses during the course of her pregnancy. Her medical and surgical histories were noncontributory.   I counseled this couple regarding the above risks and  available options.  The approximate face-to-face time with the genetic counselor was 35 minutes.  Cam Hai, MS Certified Genetic Counselor

## 2015-11-03 ENCOUNTER — Encounter (HOSPITAL_COMMUNITY): Payer: Self-pay

## 2015-11-03 ENCOUNTER — Inpatient Hospital Stay (HOSPITAL_COMMUNITY): Payer: Medicaid Other

## 2015-11-03 ENCOUNTER — Inpatient Hospital Stay (HOSPITAL_COMMUNITY)
Admission: AD | Admit: 2015-11-03 | Discharge: 2015-11-04 | Disposition: A | Discharge status: Home / Self Care | Payer: Medicaid Other | Source: Ambulatory Visit | Attending: Obstetrics and Gynecology | Admitting: Obstetrics and Gynecology

## 2015-11-03 DIAGNOSIS — R109 Unspecified abdominal pain: Secondary | ICD-10-CM | POA: Diagnosis not present

## 2015-11-03 DIAGNOSIS — O209 Hemorrhage in early pregnancy, unspecified: Secondary | ICD-10-CM | POA: Insufficient documentation

## 2015-11-03 DIAGNOSIS — A599 Trichomoniasis, unspecified: Secondary | ICD-10-CM | POA: Diagnosis present

## 2015-11-03 DIAGNOSIS — A5909 Other urogenital trichomoniasis: Secondary | ICD-10-CM | POA: Insufficient documentation

## 2015-11-03 DIAGNOSIS — N898 Other specified noninflammatory disorders of vagina: Secondary | ICD-10-CM | POA: Insufficient documentation

## 2015-11-03 DIAGNOSIS — Z3A14 14 weeks gestation of pregnancy: Secondary | ICD-10-CM | POA: Diagnosis not present

## 2015-11-03 DIAGNOSIS — N939 Abnormal uterine and vaginal bleeding, unspecified: Secondary | ICD-10-CM

## 2015-11-03 LAB — CBC
HCT: 33.6 % — ABNORMAL LOW (ref 36.0–46.0)
Hemoglobin: 11.3 g/dL — ABNORMAL LOW (ref 12.0–15.0)
MCH: 28.5 pg (ref 26.0–34.0)
MCHC: 33.6 g/dL (ref 30.0–36.0)
MCV: 84.8 fL (ref 78.0–100.0)
PLATELETS: 244 10*3/uL (ref 150–400)
RBC: 3.96 MIL/uL (ref 3.87–5.11)
RDW: 13 % (ref 11.5–15.5)
WBC: 13.4 10*3/uL — AB (ref 4.0–10.5)

## 2015-11-03 LAB — URINALYSIS, ROUTINE W REFLEX MICROSCOPIC
Bilirubin Urine: NEGATIVE
Glucose, UA: NEGATIVE mg/dL
KETONES UR: NEGATIVE mg/dL
Nitrite: NEGATIVE
PROTEIN: NEGATIVE mg/dL
Specific Gravity, Urine: 1.015 (ref 1.005–1.030)
pH: 6.5 (ref 5.0–8.0)

## 2015-11-03 LAB — URINE MICROSCOPIC-ADD ON

## 2015-11-03 LAB — WET PREP, GENITAL
CLUE CELLS WET PREP: NONE SEEN
SPERM: NONE SEEN
Yeast Wet Prep HPF POC: NONE SEEN
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
## 2015-11-03 NOTE — MAU Note (Signed)
Pt states that when she went to the bathroom about 1-2 hours, she noticed some blood on the tissue. Has not seen any more blood since but has some lower-mid abdominal pain. Pt has an umbilical hernia and is unsure if that is what is causing pain. Rates 4/10. Did not take anything, just rest. Denies urinary s/s.

## 2015-11-04 DIAGNOSIS — A599 Trichomoniasis, unspecified: Secondary | ICD-10-CM | POA: Diagnosis present

## 2015-11-04 LAB — HCG, QUANTITATIVE, PREGNANCY: hCG, Beta Chain, Quant, S: 60259 m[IU]/mL — ABNORMAL HIGH (ref <5)

## 2015-11-04 MED ORDER — ONDANSETRON 8 MG PO TBDP
8.0000 mg | ORAL_TABLET | Freq: Once | ORAL | Status: AC
  Administered 2015-11-04: 8 mg via ORAL
  Filled 2015-11-04: qty 1

## 2015-11-04 MED ORDER — METRONIDAZOLE 500 MG PO TABS
2000.0000 mg | ORAL_TABLET | Freq: Once | ORAL | Status: AC
  Administered 2015-11-04: 2000 mg via ORAL
  Filled 2015-11-04: qty 4

## 2015-11-04 MED ORDER — ACETAMINOPHEN 325 MG PO TABS
650.0000 mg | ORAL_TABLET | Freq: Four times a day (QID) | ORAL | Status: DC | PRN
  Administered 2015-11-04: 650 mg via ORAL
  Filled 2015-11-04: qty 2

## 2015-11-04 NOTE — Discharge Instructions (Signed)
Trichomoniasis Trichomoniasis is an infection caused by an organism called Trichomonas. The infection can affect both women and men. In women, the outer female genitalia and the vagina are affected. In men, the penis is mainly affected, but the prostate and other reproductive organs can also be involved. Trichomoniasis is a sexually transmitted infection (STI) and is most often passed to another person through sexual contact.  RISK FACTORS  Having unprotected sexual intercourse.  Having sexual intercourse with an infected partner. SIGNS AND SYMPTOMS  Symptoms of trichomoniasis in women include:  Abnormal gray-green frothy vaginal discharge.  Itching and irritation of the vagina.  Itching and irritation of the area outside the vagina. Symptoms of trichomoniasis in men include:   Penile discharge with or without pain.  Pain during urination. This results from inflammation of the urethra. DIAGNOSIS  Trichomoniasis may be found during a Pap test or physical exam. Your health care provider may use one of the following methods to help diagnose this infection:  Testing the pH of the vagina with a test tape.  Using a vaginal swab test that checks for the Trichomonas organism. A test is available that provides results within a few minutes.  Examining a urine sample.  Testing vaginal secretions. Your health care provider may test you for other STIs, including HIV. TREATMENT   You may be given medicine to fight the infection. Women should inform their health care provider if they could be or are pregnant. Some medicines used to treat the infection should not be taken during pregnancy.  Your health care provider may recommend over-the-counter medicines or creams to decrease itching or irritation.  Your sexual partner will need to be treated if infected.  Your health care provider may test you for infection again 3 months after treatment. HOME CARE INSTRUCTIONS   Take medicines only as  directed by your health care provider.  Take over-the-counter medicine for itching or irritation as directed by your health care provider.  Do not have sexual intercourse while you have the infection.  Women should not douche or wear tampons while they have the infection.  Discuss your infection with your partner. Your partner may have gotten the infection from you, or you may have gotten it from your partner.  Have your sex partner get examined and treated if necessary.  Practice safe, informed, and protected sex.  See your health care provider for other STI testing. SEEK MEDICAL CARE IF:   You still have symptoms after you finish your medicine.  You develop abdominal pain.  You have pain when you urinate.  You have bleeding after sexual intercourse.  You develop a rash.  Your medicine makes you sick or makes you throw up (vomit). MAKE SURE YOU:  Understand these instructions.  Will watch your condition.  Will get help right away if you are not doing well or get worse.   This information is not intended to replace advice given to you by your health care provider. Make sure you discuss any questions you have with your health care provider.   Document Released: 04/08/2001 Document Revised: 11/03/2014 Document Reviewed: 07/25/2013 Elsevier Interactive Patient Education 2016 Elsevier Inc.  

## 2015-11-05 LAB — GC/CHLAMYDIA PROBE AMP (~~LOC~~) NOT AT ARMC
Chlamydia: NEGATIVE
Neisseria Gonorrhea: NEGATIVE

## 2015-12-04 ENCOUNTER — Encounter (HOSPITAL_COMMUNITY): Payer: Self-pay

## 2015-12-04 ENCOUNTER — Ambulatory Visit (HOSPITAL_COMMUNITY)
Admission: RE | Admit: 2015-12-04 | Discharge: 2015-12-04 | Disposition: A | Discharge status: Home / Self Care | Payer: Medicaid Other | Source: Ambulatory Visit | Attending: Obstetrics and Gynecology | Admitting: Obstetrics and Gynecology

## 2015-12-04 ENCOUNTER — Other Ambulatory Visit (HOSPITAL_COMMUNITY): Payer: Self-pay | Admitting: Maternal and Fetal Medicine

## 2015-12-04 VITALS — BP 112/66 | HR 89 | Wt 226.8 lb

## 2015-12-04 DIAGNOSIS — O09211 Supervision of pregnancy with history of pre-term labor, first trimester: Secondary | ICD-10-CM

## 2015-12-04 DIAGNOSIS — Z8279 Family history of other congenital malformations, deformations and chromosomal abnormalities: Secondary | ICD-10-CM

## 2015-12-04 DIAGNOSIS — Z3A18 18 weeks gestation of pregnancy: Secondary | ICD-10-CM

## 2015-12-04 DIAGNOSIS — O34219 Maternal care for unspecified type scar from previous cesarean delivery: Secondary | ICD-10-CM

## 2015-12-04 DIAGNOSIS — O09891 Supervision of other high risk pregnancies, first trimester: Secondary | ICD-10-CM

## 2015-12-04 DIAGNOSIS — Z98891 History of uterine scar from previous surgery: Secondary | ICD-10-CM

## 2015-12-18 ENCOUNTER — Ambulatory Visit (HOSPITAL_COMMUNITY): Payer: Medicaid Other

## 2016-01-01 ENCOUNTER — Ambulatory Visit (HOSPITAL_COMMUNITY): Admission: RE | Admit: 2016-01-01 | Payer: Medicaid Other | Source: Ambulatory Visit

## 2016-01-03 ENCOUNTER — Encounter (HOSPITAL_COMMUNITY): Payer: Self-pay

## 2016-01-03 ENCOUNTER — Ambulatory Visit (HOSPITAL_COMMUNITY)
Admission: RE | Admit: 2016-01-03 | Discharge: 2016-01-03 | Disposition: A | Discharge status: Home / Self Care | Payer: Medicaid Other | Source: Ambulatory Visit | Attending: Obstetrics and Gynecology | Admitting: Obstetrics and Gynecology

## 2016-01-03 ENCOUNTER — Other Ambulatory Visit (HOSPITAL_COMMUNITY): Payer: Self-pay | Admitting: Maternal and Fetal Medicine

## 2016-01-03 DIAGNOSIS — O34219 Maternal care for unspecified type scar from previous cesarean delivery: Secondary | ICD-10-CM | POA: Diagnosis not present

## 2016-01-03 DIAGNOSIS — Z8279 Family history of other congenital malformations, deformations and chromosomal abnormalities: Secondary | ICD-10-CM

## 2016-01-03 DIAGNOSIS — Z0489 Encounter for examination and observation for other specified reasons: Secondary | ICD-10-CM

## 2016-01-03 DIAGNOSIS — O09212 Supervision of pregnancy with history of pre-term labor, second trimester: Secondary | ICD-10-CM | POA: Diagnosis not present

## 2016-01-03 DIAGNOSIS — O09211 Supervision of pregnancy with history of pre-term labor, first trimester: Secondary | ICD-10-CM

## 2016-01-03 DIAGNOSIS — O09892 Supervision of other high risk pregnancies, second trimester: Secondary | ICD-10-CM

## 2016-01-03 DIAGNOSIS — O09891 Supervision of other high risk pregnancies, first trimester: Secondary | ICD-10-CM

## 2016-01-03 DIAGNOSIS — Z3A22 22 weeks gestation of pregnancy: Secondary | ICD-10-CM | POA: Diagnosis not present

## 2016-01-03 DIAGNOSIS — IMO0002 Reserved for concepts with insufficient information to code with codable children: Secondary | ICD-10-CM

## 2016-01-03 DIAGNOSIS — Z36 Encounter for antenatal screening of mother: Secondary | ICD-10-CM | POA: Diagnosis not present

## 2016-01-03 DIAGNOSIS — Z98891 History of uterine scar from previous surgery: Secondary | ICD-10-CM

## 2016-01-15 ENCOUNTER — Other Ambulatory Visit (HOSPITAL_COMMUNITY): Payer: Self-pay | Admitting: Maternal and Fetal Medicine

## 2016-01-15 ENCOUNTER — Ambulatory Visit (HOSPITAL_COMMUNITY)
Admission: RE | Admit: 2016-01-15 | Discharge: 2016-01-15 | Disposition: A | Discharge status: Home / Self Care | Payer: Medicaid Other | Source: Ambulatory Visit | Attending: Obstetrics and Gynecology | Admitting: Obstetrics and Gynecology

## 2016-01-15 ENCOUNTER — Encounter (HOSPITAL_COMMUNITY): Payer: Self-pay

## 2016-01-15 DIAGNOSIS — O09212 Supervision of pregnancy with history of pre-term labor, second trimester: Secondary | ICD-10-CM | POA: Insufficient documentation

## 2016-01-15 DIAGNOSIS — Z3A24 24 weeks gestation of pregnancy: Secondary | ICD-10-CM

## 2016-01-15 DIAGNOSIS — O352XX0 Maternal care for (suspected) hereditary disease in fetus, not applicable or unspecified: Secondary | ICD-10-CM | POA: Insufficient documentation

## 2016-01-15 DIAGNOSIS — O4442 Low lying placenta NOS or without hemorrhage, second trimester: Secondary | ICD-10-CM | POA: Diagnosis not present

## 2016-01-15 DIAGNOSIS — O4440 Low lying placenta NOS or without hemorrhage, unspecified trimester: Secondary | ICD-10-CM

## 2016-01-15 DIAGNOSIS — O09891 Supervision of other high risk pregnancies, first trimester: Secondary | ICD-10-CM

## 2016-01-15 DIAGNOSIS — O34219 Maternal care for unspecified type scar from previous cesarean delivery: Secondary | ICD-10-CM | POA: Insufficient documentation

## 2016-01-15 DIAGNOSIS — O09211 Supervision of pregnancy with history of pre-term labor, first trimester: Secondary | ICD-10-CM

## 2016-01-15 DIAGNOSIS — Z98891 History of uterine scar from previous surgery: Secondary | ICD-10-CM

## 2016-01-31 ENCOUNTER — Inpatient Hospital Stay (HOSPITAL_COMMUNITY)
Admission: AD | Admit: 2016-01-31 | Discharge: 2016-01-31 | Disposition: A | Discharge status: Home / Self Care | Payer: Medicaid Other | Source: Ambulatory Visit | Attending: Obstetrics and Gynecology | Admitting: Obstetrics and Gynecology

## 2016-01-31 ENCOUNTER — Encounter (HOSPITAL_COMMUNITY): Payer: Self-pay | Admitting: *Deleted

## 2016-01-31 DIAGNOSIS — R51 Headache: Secondary | ICD-10-CM | POA: Diagnosis not present

## 2016-01-31 DIAGNOSIS — R05 Cough: Secondary | ICD-10-CM | POA: Insufficient documentation

## 2016-01-31 DIAGNOSIS — Z87891 Personal history of nicotine dependence: Secondary | ICD-10-CM | POA: Diagnosis not present

## 2016-01-31 DIAGNOSIS — R109 Unspecified abdominal pain: Secondary | ICD-10-CM | POA: Diagnosis not present

## 2016-01-31 DIAGNOSIS — Z3A26 26 weeks gestation of pregnancy: Secondary | ICD-10-CM | POA: Diagnosis not present

## 2016-01-31 DIAGNOSIS — O26892 Other specified pregnancy related conditions, second trimester: Secondary | ICD-10-CM | POA: Diagnosis not present

## 2016-01-31 DIAGNOSIS — J069 Acute upper respiratory infection, unspecified: Secondary | ICD-10-CM | POA: Diagnosis not present

## 2016-01-31 DIAGNOSIS — Z880 Allergy status to penicillin: Secondary | ICD-10-CM | POA: Diagnosis not present

## 2016-01-31 LAB — URINALYSIS, ROUTINE W REFLEX MICROSCOPIC
Bilirubin Urine: NEGATIVE
Glucose, UA: NEGATIVE mg/dL
HGB URINE DIPSTICK: NEGATIVE
Ketones, ur: NEGATIVE mg/dL
LEUKOCYTES UA: NEGATIVE
NITRITE: NEGATIVE
PROTEIN: NEGATIVE mg/dL
SPECIFIC GRAVITY, URINE: 1.02 (ref 1.005–1.030)
pH: 6 (ref 5.0–8.0)

## 2016-01-31 MED ORDER — PSEUDOEPHEDRINE HCL 30 MG PO TABS
30.0000 mg | ORAL_TABLET | Freq: Once | ORAL | Status: AC
  Administered 2016-01-31: 30 mg via ORAL
  Filled 2016-01-31: qty 1

## 2016-01-31 MED ORDER — IBUPROFEN 600 MG PO TABS
600.0000 mg | ORAL_TABLET | Freq: Once | ORAL | Status: AC
  Administered 2016-01-31: 600 mg via ORAL
  Filled 2016-01-31: qty 1

## 2016-01-31 MED ORDER — GUAIFENESIN ER 600 MG PO TB12
600.0000 mg | ORAL_TABLET | Freq: Once | ORAL | Status: AC
  Administered 2016-01-31: 600 mg via ORAL
  Filled 2016-01-31: qty 1

## 2016-01-31 MED ORDER — MENTHOL 3 MG MT LOZG
1.0000 | LOZENGE | OROMUCOSAL | Status: DC | PRN
  Administered 2016-01-31: 3 mg via ORAL
  Filled 2016-01-31: qty 9

## 2016-01-31 NOTE — MAU Note (Addendum)
Had a cold & cough for past 2 days;c/o upper abdominal pain that started yesterday; c/o headache that started around 0530 this AM when she woke up; has rated her headache and abdominal pain at 10 but she has not taken any medicine for  Either pain;

## 2016-01-31 NOTE — MAU Note (Signed)
Pt stated she has ben having a cough, headache and upeer abd pain cramping x 2 days.

## 2016-01-31 NOTE — Discharge Instructions (Signed)
Upper Respiratory Infection, Adult Most upper respiratory infections (URIs) are a viral infection of the air passages leading to the lungs. A URI affects the nose, throat, and upper air passages. The most common type of URI is nasopharyngitis and is typically referred to as "the common cold." URIs run their course and usually go away on their own. Most of the time, a URI does not require medical attention, but sometimes a bacterial infection in the upper airways can follow a viral infection. This is called a secondary infection. Sinus and middle ear infections are common types of secondary upper respiratory infections. Bacterial pneumonia can also complicate a URI. A URI can worsen asthma and chronic obstructive pulmonary disease (COPD). Sometimes, these complications can require emergency medical care and may be life threatening.  CAUSES Almost all URIs are caused by viruses. A virus is a type of germ and can spread from one person to another.  RISKS FACTORS You may be at risk for a URI if:   You smoke.   You have chronic heart or lung disease.  You have a weakened defense (immune) system.   You are very young or very old.   You have nasal allergies or asthma.  You work in crowded or poorly ventilated areas.  You work in health care facilities or schools. SIGNS AND SYMPTOMS  Symptoms typically develop 2-3 days after you come in contact with a cold virus. Most viral URIs last 7-10 days. However, viral URIs from the influenza virus (flu virus) can last 14-18 days and are typically more severe. Symptoms may include:   Runny or stuffy (congested) nose.   Sneezing.   Cough.   Sore throat.   Headache.   Fatigue.   Fever.   Loss of appetite.   Pain in your forehead, behind your eyes, and over your cheekbones (sinus pain).  Muscle aches.  DIAGNOSIS  Your health care provider may diagnose a URI by:  Physical exam.  Tests to check that your symptoms are not due to  another condition such as:  Strep throat.  Sinusitis.  Pneumonia.  Asthma. TREATMENT  A URI goes away on its own with time. It cannot be cured with medicines, but medicines may be prescribed or recommended to relieve symptoms. Medicines may help:  Reduce your fever.  Reduce your cough.  Relieve nasal congestion. HOME CARE INSTRUCTIONS   Take medicines only as directed by your health care provider.   Gargle warm saltwater or take cough drops to comfort your throat as directed by your health care provider.  Use a warm mist humidifier or inhale steam from a shower to increase air moisture. This may make it easier to breathe.  Drink enough fluid to keep your urine clear or pale yellow.   Eat soups and other clear broths and maintain good nutrition.   Rest as needed.   Return to work when your temperature has returned to normal or as your health care provider advises. You may need to stay home longer to avoid infecting others. You can also use a face mask and careful hand washing to prevent spread of the virus.  Increase the usage of your inhaler if you have asthma.   Do not use any tobacco products, including cigarettes, chewing tobacco, or electronic cigarettes. If you need help quitting, ask your health care provider. PREVENTION  The best way to protect yourself from getting a cold is to practice good hygiene.   Avoid oral or hand contact with people with cold   symptoms.   Wash your hands often if contact occurs.  There is no clear evidence that vitamin C, vitamin E, echinacea, or exercise reduces the chance of developing a cold. However, it is always recommended to get plenty of rest, exercise, and practice good nutrition.  SEEK MEDICAL CARE IF:   You are getting worse rather than better.   Your symptoms are not controlled by medicine.   You have chills.  You have worsening shortness of breath.  You have brown or red mucus.  You have yellow or brown nasal  discharge.  You have pain in your face, especially when you bend forward.  You have a fever.  You have swollen neck glands.  You have pain while swallowing.  You have white areas in the back of your throat. SEEK IMMEDIATE MEDICAL CARE IF:   You have severe or persistent:  Headache.  Ear pain.  Sinus pain.  Chest pain.  You have chronic lung disease and any of the following:  Wheezing.  Prolonged cough.  Coughing up blood.  A change in your usual mucus.  You have a stiff neck.  You have changes in your:  Vision.  Hearing.  Thinking.  Mood. MAKE SURE YOU:   Understand these instructions.  Will watch your condition.  Will get help right away if you are not doing well or get worse.   This information is not intended to replace advice given to you by your health care provider. Make sure you discuss any questions you have with your health care provider.   Document Released: 04/08/2001 Document Revised: 02/27/2015 Document Reviewed: 01/18/2014 Elsevier Interactive Patient Education 2016 Elsevier Inc.  
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
## 2016-02-19 ENCOUNTER — Inpatient Hospital Stay (HOSPITAL_COMMUNITY)
Admission: AD | Admit: 2016-02-19 | Discharge: 2016-02-19 | Disposition: A | Discharge status: Home / Self Care | Payer: Medicaid Other | Source: Ambulatory Visit | Attending: Obstetrics & Gynecology | Admitting: Obstetrics & Gynecology

## 2016-02-19 ENCOUNTER — Encounter (HOSPITAL_COMMUNITY): Payer: Self-pay | Admitting: *Deleted

## 2016-02-19 DIAGNOSIS — O23593 Infection of other part of genital tract in pregnancy, third trimester: Secondary | ICD-10-CM | POA: Diagnosis not present

## 2016-02-19 DIAGNOSIS — B9689 Other specified bacterial agents as the cause of diseases classified elsewhere: Secondary | ICD-10-CM

## 2016-02-19 DIAGNOSIS — Z880 Allergy status to penicillin: Secondary | ICD-10-CM | POA: Insufficient documentation

## 2016-02-19 DIAGNOSIS — Z91013 Allergy to seafood: Secondary | ICD-10-CM | POA: Diagnosis not present

## 2016-02-19 DIAGNOSIS — Z3A29 29 weeks gestation of pregnancy: Secondary | ICD-10-CM | POA: Insufficient documentation

## 2016-02-19 DIAGNOSIS — Z881 Allergy status to other antibiotic agents status: Secondary | ICD-10-CM | POA: Diagnosis not present

## 2016-02-19 DIAGNOSIS — Z882 Allergy status to sulfonamides status: Secondary | ICD-10-CM | POA: Insufficient documentation

## 2016-02-19 DIAGNOSIS — N760 Acute vaginitis: Secondary | ICD-10-CM

## 2016-02-19 DIAGNOSIS — Z91040 Latex allergy status: Secondary | ICD-10-CM | POA: Diagnosis not present

## 2016-02-19 DIAGNOSIS — Z87891 Personal history of nicotine dependence: Secondary | ICD-10-CM | POA: Diagnosis not present

## 2016-02-19 DIAGNOSIS — N898 Other specified noninflammatory disorders of vagina: Secondary | ICD-10-CM | POA: Diagnosis present

## 2016-02-19 LAB — URINE MICROSCOPIC-ADD ON

## 2016-02-19 LAB — URINALYSIS, ROUTINE W REFLEX MICROSCOPIC
Bilirubin Urine: NEGATIVE
Glucose, UA: NEGATIVE mg/dL
Ketones, ur: NEGATIVE mg/dL
NITRITE: NEGATIVE
PROTEIN: NEGATIVE mg/dL
SPECIFIC GRAVITY, URINE: 1.015 (ref 1.005–1.030)
pH: 6.5 (ref 5.0–8.0)

## 2016-02-19 LAB — WET PREP, GENITAL
Sperm: NONE SEEN
Trich, Wet Prep: NONE SEEN
YEAST WET PREP: NONE SEEN

## 2016-02-19 MED ORDER — METRONIDAZOLE 500 MG PO TABS: 500.0000 mg | ORAL_TABLET | Freq: Two times a day (BID) | ORAL | Status: AC

## 2016-02-19 NOTE — Discharge Instructions (Signed)

## 2016-02-19 NOTE — MAU Note (Addendum)
?   Yeast infection.  Has a discharge, with itching and burning.  Causing cramping.  Seems to happen/get worse when she gets her 17P injections. Did OTC monitstat the last time, seemed to fade away, now after the last injection, it seems to have blown up again
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
## 2016-02-20 LAB — GC/CHLAMYDIA PROBE AMP (~~LOC~~) NOT AT ARMC
Chlamydia: NEGATIVE
Neisseria Gonorrhea: NEGATIVE

## 2016-03-20 ENCOUNTER — Other Ambulatory Visit: Payer: Self-pay | Admitting: Obstetrics and Gynecology

## 2016-04-21 ENCOUNTER — Encounter (HOSPITAL_COMMUNITY): Payer: Self-pay

## 2016-04-25 ENCOUNTER — Encounter (HOSPITAL_COMMUNITY)
Admission: RE | Admit: 2016-04-25 | Discharge: 2016-04-25 | Disposition: A | Discharge status: Home / Self Care | Payer: Medicaid Other | Source: Ambulatory Visit | Attending: Cardiology | Admitting: Cardiology

## 2016-04-25 NOTE — Patient Instructions (Signed)
20 [name]  04/25/2016   Your procedure is scheduled on:  04/28/2016  Enter [facility] at 0930 AM.  Pick up the phone at the desk and dial [phone].   Call this number if you have problems the morning of surgery: [phone]   Remember:   Do not eat food:After Midnight.  Do not drink clear liquids: After Midnight.  Take these medicines the morning of surgery with A SIP OF WATER: none   Do not wear jewelry, make-up or nail polish.  Do not wear lotions, powders, or perfumes. You may wear deodorant.  Do not shave 48 hours prior to surgery.  Do not bring valuables to the hospital.  [facility] is not   responsible for any belongings or valuables brought to the hospital.  Contacts, dentures or bridgework may not be worn into surgery.  Leave suitcase in the car. After surgery it may be brought to your room.  For patients admitted to the hospital, checkout time is 11:00 AM the day of              discharge.   Patients discharged the day of surgery will not be allowed to drive             home.  Name and phone number of your driver: na  Special Instructions:   N/A   Please read over the following fact sheets that you were given:   Surgical Site Infection Prevention

## 2016-04-27 MED ORDER — GENTAMICIN SULFATE 40 MG/ML IJ SOLN
INTRAVENOUS | Status: AC
  Administered 2016-04-28: 115.5 mL via INTRAVENOUS
  Filled 2016-04-27: qty 9.5

## 2016-04-28 ENCOUNTER — Inpatient Hospital Stay (HOSPITAL_COMMUNITY): Payer: Medicaid Other | Admitting: Anesthesiology

## 2016-04-28 ENCOUNTER — Inpatient Hospital Stay (HOSPITAL_COMMUNITY)
Admission: RE | Admit: 2016-04-28 | Discharge: 2016-05-01 | DRG: 765 | Disposition: A | Discharge status: Home / Self Care | Payer: Medicaid Other | Source: Ambulatory Visit | Attending: Obstetrics and Gynecology | Admitting: Obstetrics and Gynecology

## 2016-04-28 ENCOUNTER — Encounter (HOSPITAL_COMMUNITY): Payer: Self-pay

## 2016-04-28 ENCOUNTER — Encounter (HOSPITAL_COMMUNITY)
Admission: RE | Disposition: A | Discharge status: Home / Self Care | Payer: Self-pay | Source: Ambulatory Visit | Attending: Obstetrics and Gynecology

## 2016-04-28 DIAGNOSIS — O34211 Maternal care for low transverse scar from previous cesarean delivery: Secondary | ICD-10-CM | POA: Diagnosis present

## 2016-04-28 DIAGNOSIS — Z6841 Body Mass Index (BMI) 40.0 and over, adult: Secondary | ICD-10-CM

## 2016-04-28 DIAGNOSIS — O99214 Obesity complicating childbirth: Secondary | ICD-10-CM | POA: Diagnosis present

## 2016-04-28 DIAGNOSIS — Z302 Encounter for sterilization: Secondary | ICD-10-CM

## 2016-04-28 DIAGNOSIS — K219 Gastro-esophageal reflux disease without esophagitis: Secondary | ICD-10-CM | POA: Diagnosis present

## 2016-04-28 DIAGNOSIS — Z98891 History of uterine scar from previous surgery: Secondary | ICD-10-CM

## 2016-04-28 DIAGNOSIS — Z87891 Personal history of nicotine dependence: Secondary | ICD-10-CM

## 2016-04-28 DIAGNOSIS — Z87442 Personal history of urinary calculi: Secondary | ICD-10-CM

## 2016-04-28 DIAGNOSIS — Z3A39 39 weeks gestation of pregnancy: Secondary | ICD-10-CM

## 2016-04-28 LAB — CBC
HEMATOCRIT: 31.2 % — AB (ref 36.0–46.0)
Hemoglobin: 10.3 g/dL — ABNORMAL LOW (ref 12.0–15.0)
MCH: 26.4 pg (ref 26.0–34.0)
MCHC: 33 g/dL (ref 30.0–36.0)
MCV: 80 fL (ref 78.0–100.0)
PLATELETS: 168 10*3/uL (ref 150–400)
RBC: 3.9 MIL/uL (ref 3.87–5.11)
RDW: 14.5 % (ref 11.5–15.5)
WBC: 7.4 10*3/uL (ref 4.0–10.5)

## 2016-04-28 LAB — PREPARE RBC (CROSSMATCH)

## 2016-04-28 SURGERY — Surgical Case
Anesthesia: Spinal | Laterality: Bilateral

## 2016-04-28 MED ORDER — NALBUPHINE HCL 10 MG/ML IJ SOLN
5.0000 mg | Freq: Once | INTRAMUSCULAR | Status: AC | PRN
  Administered 2016-04-28: 5 mg via SUBCUTANEOUS

## 2016-04-28 MED ORDER — PRENATAL MULTIVITAMIN CH
1.0000 | ORAL_TABLET | Freq: Every day | ORAL | Status: DC
Start: 2016-04-29 — End: 2016-05-01
  Administered 2016-04-29 – 2016-05-01 (×3): 1 via ORAL
  Filled 2016-04-28 (×3): qty 1

## 2016-04-28 MED ORDER — NALOXONE HCL 0.4 MG/ML IJ SOLN: 0.4000 mg | INTRAMUSCULAR | Status: DC | PRN

## 2016-04-28 MED ORDER — KETOROLAC TROMETHAMINE 30 MG/ML IJ SOLN: 30.0000 mg | Freq: Four times a day (QID) | INTRAMUSCULAR | Status: AC | PRN

## 2016-04-28 MED ORDER — BUPIVACAINE IN DEXTROSE 0.75-8.25 % IT SOLN
INTRATHECAL | Status: AC
  Filled 2016-04-28: qty 2

## 2016-04-28 MED ORDER — NALBUPHINE HCL 10 MG/ML IJ SOLN: 5.0000 mg | INTRAMUSCULAR | Status: DC | PRN

## 2016-04-28 MED ORDER — IBUPROFEN 600 MG PO TABS
600.0000 mg | ORAL_TABLET | Freq: Four times a day (QID) | ORAL | Status: DC
  Administered 2016-04-29 – 2016-05-01 (×11): 600 mg via ORAL
  Filled 2016-04-28 (×11): qty 1

## 2016-04-28 MED ORDER — PHENYLEPHRINE 8 MG IN D5W 100 ML (0.08MG/ML) PREMIX OPTIME
INJECTION | INTRAVENOUS | Status: DC | PRN
  Administered 2016-04-28: 60 ug/min via INTRAVENOUS

## 2016-04-28 MED ORDER — PHENYLEPHRINE 8 MG IN D5W 100 ML (0.08MG/ML) PREMIX OPTIME
INJECTION | INTRAVENOUS | Status: AC
  Filled 2016-04-28: qty 100

## 2016-04-28 MED ORDER — DIPHENHYDRAMINE HCL 50 MG/ML IJ SOLN: 12.5000 mg | INTRAMUSCULAR | Status: DC | PRN

## 2016-04-28 MED ORDER — NALBUPHINE HCL 10 MG/ML IJ SOLN
5.0000 mg | INTRAMUSCULAR | Status: DC | PRN
  Administered 2016-04-29 (×2): 5 mg via SUBCUTANEOUS
  Filled 2016-04-28 (×3): qty 1

## 2016-04-28 MED ORDER — OXYTOCIN 10 UNIT/ML IJ SOLN
40.0000 [IU] | INTRAMUSCULAR | Status: DC | PRN
  Administered 2016-04-28: 40 [IU] via INTRAVENOUS

## 2016-04-28 MED ORDER — SIMETHICONE 80 MG PO CHEW
80.0000 mg | CHEWABLE_TABLET | Freq: Three times a day (TID) | ORAL | Status: DC
  Administered 2016-04-28 – 2016-05-01 (×8): 80 mg via ORAL
  Filled 2016-04-28 (×8): qty 1

## 2016-04-28 MED ORDER — COCONUT OIL OIL
1.0000 | TOPICAL_OIL | Status: DC | PRN
Start: 2016-04-28 — End: 2016-05-01
  Filled 2016-04-28: qty 120

## 2016-04-28 MED ORDER — NALOXONE HCL 2 MG/2ML IJ SOSY: 1.0000 ug/kg/h | PREFILLED_SYRINGE | INTRAVENOUS | Status: DC | PRN

## 2016-04-28 MED ORDER — LACTATED RINGERS IV SOLN
INTRAVENOUS | Status: DC
  Administered 2016-04-28: via INTRAVENOUS

## 2016-04-28 MED ORDER — SENNOSIDES-DOCUSATE SODIUM 8.6-50 MG PO TABS
2.0000 | ORAL_TABLET | ORAL | Status: DC
  Administered 2016-04-29 – 2016-04-30 (×3): 2 via ORAL
  Filled 2016-04-28 (×3): qty 2

## 2016-04-28 MED ORDER — ONDANSETRON HCL 4 MG/2ML IJ SOLN: 4.0000 mg | Freq: Three times a day (TID) | INTRAMUSCULAR | Status: DC | PRN

## 2016-04-28 MED ORDER — NALBUPHINE HCL 10 MG/ML IJ SOLN
5.0000 mg | Freq: Once | INTRAMUSCULAR | Status: DC | PRN
Start: 2016-04-28 — End: 2016-04-28

## 2016-04-28 MED ORDER — SODIUM CHLORIDE 0.9 % IR SOLN
Status: DC | PRN
  Administered 2016-04-28: 1

## 2016-04-28 MED ORDER — OXYTOCIN 10 UNIT/ML IJ SOLN
INTRAMUSCULAR | Status: AC
  Filled 2016-04-28: qty 4

## 2016-04-28 MED ORDER — SCOPOLAMINE 1 MG/3DAYS TD PT72
1.0000 | MEDICATED_PATCH | Freq: Once | TRANSDERMAL | Status: DC
  Administered 2016-04-28: 1.5 mg via TRANSDERMAL

## 2016-04-28 MED ORDER — FENTANYL CITRATE (PF) 100 MCG/2ML IJ SOLN
INTRAMUSCULAR | Status: AC
Start: 2016-04-28 — End: 2016-04-28
  Filled 2016-04-28: qty 2

## 2016-04-28 MED ORDER — SIMETHICONE 80 MG PO CHEW: 80.0000 mg | CHEWABLE_TABLET | ORAL | Status: DC | PRN

## 2016-04-28 MED ORDER — DIPHENHYDRAMINE HCL 25 MG PO CAPS
25.0000 mg | ORAL_CAPSULE | ORAL | Status: DC | PRN
  Filled 2016-04-28: qty 1

## 2016-04-28 MED ORDER — BUPIVACAINE IN DEXTROSE 0.75-8.25 % IT SOLN
INTRATHECAL | Status: DC | PRN
  Administered 2016-04-28: 12 mg via INTRATHECAL

## 2016-04-28 MED ORDER — TETANUS-DIPHTH-ACELL PERTUSSIS 5-2.5-18.5 LF-MCG/0.5 IM SUSP: 0.5000 mL | Freq: Once | INTRAMUSCULAR | Status: DC

## 2016-04-28 MED ORDER — DIPHENHYDRAMINE HCL 25 MG PO CAPS
25.0000 mg | ORAL_CAPSULE | Freq: Four times a day (QID) | ORAL | Status: DC | PRN
  Administered 2016-04-29: 25 mg via ORAL
  Filled 2016-04-28: qty 1

## 2016-04-28 MED ORDER — ZOLPIDEM TARTRATE 5 MG PO TABS: 5.0000 mg | ORAL_TABLET | Freq: Every evening | ORAL | Status: DC | PRN

## 2016-04-28 MED ORDER — OXYTOCIN 40 UNITS IN LACTATED RINGERS INFUSION - SIMPLE MED: 2.5000 [IU]/h | INTRAVENOUS | Status: AC

## 2016-04-28 MED ORDER — ONDANSETRON HCL 4 MG/2ML IJ SOLN
INTRAMUSCULAR | Status: DC | PRN
  Administered 2016-04-28: 4 mg via INTRAVENOUS

## 2016-04-28 MED ORDER — SODIUM CHLORIDE 0.9% FLUSH: 3.0000 mL | INTRAVENOUS | Status: DC | PRN

## 2016-04-28 MED ORDER — ACETAMINOPHEN 325 MG PO TABS: 650.0000 mg | ORAL_TABLET | ORAL | Status: DC | PRN

## 2016-04-28 MED ORDER — ONDANSETRON HCL 4 MG/2ML IJ SOLN
INTRAMUSCULAR | Status: AC
  Filled 2016-04-28: qty 2

## 2016-04-28 MED ORDER — MEPERIDINE HCL 25 MG/ML IJ SOLN: 6.2500 mg | INTRAMUSCULAR | Status: DC | PRN

## 2016-04-28 MED ORDER — SIMETHICONE 80 MG PO CHEW
80.0000 mg | CHEWABLE_TABLET | ORAL | Status: DC
  Administered 2016-04-29 – 2016-04-30 (×3): 80 mg via ORAL
  Filled 2016-04-28 (×3): qty 1

## 2016-04-28 MED ORDER — DIPHENHYDRAMINE HCL 50 MG/ML IJ SOLN
12.5000 mg | INTRAMUSCULAR | Status: DC | PRN
  Administered 2016-04-28: 12.5 mg via INTRAVENOUS

## 2016-04-28 MED ORDER — OXYCODONE-ACETAMINOPHEN 5-325 MG PO TABS
1.0000 | ORAL_TABLET | ORAL | Status: DC | PRN
  Administered 2016-04-29 (×2): 1 via ORAL
  Filled 2016-04-28 (×4): qty 1

## 2016-04-28 MED ORDER — HYDROMORPHONE HCL 1 MG/ML IJ SOLN
0.5000 mg | INTRAMUSCULAR | Status: AC | PRN
  Administered 2016-04-28: 0.5 mg via INTRAVENOUS
  Filled 2016-04-28: qty 1

## 2016-04-28 MED ORDER — KETOROLAC TROMETHAMINE 30 MG/ML IJ SOLN
INTRAMUSCULAR | Status: AC
  Filled 2016-04-28: qty 1

## 2016-04-28 MED ORDER — LACTATED RINGERS IV SOLN
Freq: Once | INTRAVENOUS | Status: AC
  Administered 2016-04-28 (×4): via INTRAVENOUS

## 2016-04-28 MED ORDER — FENTANYL CITRATE (PF) 100 MCG/2ML IJ SOLN: 25.0000 ug | INTRAMUSCULAR | Status: DC | PRN

## 2016-04-28 MED ORDER — DIBUCAINE 1 % RE OINT: 1.0000 "application " | TOPICAL_OINTMENT | RECTAL | Status: DC | PRN

## 2016-04-28 MED ORDER — WITCH HAZEL-GLYCERIN EX PADS: 1.0000 "application " | MEDICATED_PAD | CUTANEOUS | Status: DC | PRN

## 2016-04-28 MED ORDER — DIPHENHYDRAMINE HCL 50 MG/ML IJ SOLN
INTRAMUSCULAR | Status: AC
  Filled 2016-04-28: qty 1

## 2016-04-28 MED ORDER — KETOROLAC TROMETHAMINE 30 MG/ML IJ SOLN
30.0000 mg | Freq: Four times a day (QID) | INTRAMUSCULAR | Status: DC | PRN
  Administered 2016-04-28: 30 mg via INTRAMUSCULAR

## 2016-04-28 MED ORDER — ACETAMINOPHEN 500 MG PO TABS
1000.0000 mg | ORAL_TABLET | Freq: Four times a day (QID) | ORAL | Status: AC
  Administered 2016-04-28 – 2016-04-29 (×3): 1000 mg via ORAL
  Filled 2016-04-28 (×3): qty 2

## 2016-04-28 MED ORDER — KETOROLAC TROMETHAMINE 30 MG/ML IJ SOLN: 30.0000 mg | Freq: Four times a day (QID) | INTRAMUSCULAR | Status: DC | PRN

## 2016-04-28 MED ORDER — IBUPROFEN 600 MG PO TABS: 600.0000 mg | ORAL_TABLET | Freq: Four times a day (QID) | ORAL | Status: DC | PRN

## 2016-04-28 MED ORDER — OXYCODONE-ACETAMINOPHEN 5-325 MG PO TABS
2.0000 | ORAL_TABLET | ORAL | Status: DC | PRN
  Administered 2016-04-29 – 2016-05-01 (×7): 2 via ORAL
  Filled 2016-04-28 (×6): qty 2

## 2016-04-28 MED ORDER — FENTANYL CITRATE (PF) 100 MCG/2ML IJ SOLN
INTRAMUSCULAR | Status: DC | PRN
  Administered 2016-04-28: 20 ug via INTRATHECAL

## 2016-04-28 MED ORDER — MORPHINE SULFATE (PF) 0.5 MG/ML IJ SOLN
INTRAMUSCULAR | Status: AC
  Filled 2016-04-28: qty 10

## 2016-04-28 MED ORDER — FENTANYL CITRATE (PF) 100 MCG/2ML IJ SOLN
INTRAMUSCULAR | Status: AC
  Filled 2016-04-28: qty 2

## 2016-04-28 MED ORDER — MENTHOL 3 MG MT LOZG: 1.0000 | LOZENGE | OROMUCOSAL | Status: DC | PRN

## 2016-04-28 MED ORDER — NALBUPHINE HCL 10 MG/ML IJ SOLN: 5.0000 mg | Freq: Once | INTRAMUSCULAR | Status: AC | PRN

## 2016-04-28 MED ORDER — ACETAMINOPHEN 500 MG PO TABS: 1000.0000 mg | ORAL_TABLET | Freq: Four times a day (QID) | ORAL | Status: DC

## 2016-04-28 MED ORDER — FENTANYL CITRATE (PF) 100 MCG/2ML IJ SOLN
25.0000 ug | INTRAMUSCULAR | Status: DC | PRN
  Administered 2016-04-28: 50 ug via INTRAVENOUS

## 2016-04-28 MED ORDER — MORPHINE SULFATE (PF) 0.5 MG/ML IJ SOLN
INTRAMUSCULAR | Status: DC | PRN
  Administered 2016-04-28: .2 mg via INTRATHECAL

## 2016-04-28 MED ORDER — DIPHENHYDRAMINE HCL 25 MG PO CAPS: 25.0000 mg | ORAL_CAPSULE | ORAL | Status: DC | PRN

## 2016-04-28 MED ORDER — SCOPOLAMINE 1 MG/3DAYS TD PT72
MEDICATED_PATCH | TRANSDERMAL | Status: AC
  Administered 2016-04-28: 1.5 mg via TRANSDERMAL
  Filled 2016-04-28: qty 1

## 2016-04-28 MED ORDER — NALOXONE HCL 2 MG/2ML IJ SOSY
1.0000 ug/kg/h | PREFILLED_SYRINGE | INTRAVENOUS | Status: DC | PRN
  Filled 2016-04-28: qty 2

## 2016-04-28 MED ORDER — LACTATED RINGERS IV SOLN
INTRAVENOUS | Status: DC
  Administered 2016-04-28: 10:00:00 via INTRAVENOUS

## 2016-04-28 SURGICAL SUPPLY — 39 items
APL SKNCLS STERI-STRIP NONHPOA (GAUZE/BANDAGES/DRESSINGS) ×1
BENZOIN TINCTURE PRP APPL 2/3 (GAUZE/BANDAGES/DRESSINGS) ×3 IMPLANT
CHLORAPREP W/TINT 26ML (MISCELLANEOUS) ×3 IMPLANT
CLAMP CORD UMBIL (MISCELLANEOUS) IMPLANT
CLOSURE WOUND 1/2 X4 (GAUZE/BANDAGES/DRESSINGS) ×1
CLOTH BEACON ORANGE TIMEOUT ST (SAFETY) ×3 IMPLANT
CONTAINER PREFILL 10% NBF 15ML (MISCELLANEOUS) IMPLANT
DRSG OPSITE POSTOP 4X10 (GAUZE/BANDAGES/DRESSINGS) ×3 IMPLANT
ELECT REM PT RETURN 9FT ADLT (ELECTROSURGICAL) ×3
ELECTRODE REM PT RTRN 9FT ADLT (ELECTROSURGICAL) ×1 IMPLANT
EXTRACTOR VACUUM M CUP 4 TUBE (SUCTIONS) IMPLANT
EXTRACTOR VACUUM M CUP 4' TUBE (SUCTIONS)
GLOVE BIO SURGEON STRL SZ7.5 (GLOVE) ×3 IMPLANT
GLOVE BIOGEL PI IND STRL 7.0 (GLOVE) ×2 IMPLANT
GLOVE BIOGEL PI IND STRL 7.5 (GLOVE) ×1 IMPLANT
GLOVE BIOGEL PI INDICATOR 7.0 (GLOVE) ×4
GLOVE BIOGEL PI INDICATOR 7.5 (GLOVE) ×2
GOWN STRL REUS W/TWL LRG LVL3 (GOWN DISPOSABLE) ×9 IMPLANT
KIT ABG SYR 3ML LUER SLIP (SYRINGE) IMPLANT
NDL HYPO 25X5/8 SAFETYGLIDE (NEEDLE) IMPLANT
NEEDLE HYPO 22GX1.5 SAFETY (NEEDLE) IMPLANT
NEEDLE HYPO 25X5/8 SAFETYGLIDE (NEEDLE) IMPLANT
NS IRRIG 1000ML POUR BTL (IV SOLUTION) ×3 IMPLANT
PACK C SECTION WH (CUSTOM PROCEDURE TRAY) ×3 IMPLANT
PAD OB MATERNITY 4.3X12.25 (PERSONAL CARE ITEMS) ×3 IMPLANT
PENCIL SMOKE EVAC W/HOLSTER (ELECTROSURGICAL) ×3 IMPLANT
RTRCTR C-SECT PINK 25CM LRG (MISCELLANEOUS) ×3 IMPLANT
STRIP CLOSURE SKIN 1/2X4 (GAUZE/BANDAGES/DRESSINGS) ×2 IMPLANT
SUT CHROMIC 2 0 CT 1 (SUTURE) ×3 IMPLANT
SUT MNCRL AB 3-0 PS2 27 (SUTURE) ×3 IMPLANT
SUT PLAIN 2 0 XLH (SUTURE) ×3 IMPLANT
SUT VIC AB 0 CT1 36 (SUTURE) ×3 IMPLANT
SUT VIC AB 0 CTX 36 (SUTURE) ×9
SUT VIC AB 0 CTX36XBRD ANBCTRL (SUTURE) ×3 IMPLANT
SUT VIC AB 2-0 SH 27 (SUTURE) ×6
SUT VIC AB 2-0 SH 27XBRD (SUTURE) ×2 IMPLANT
SYR CONTROL 10ML LL (SYRINGE) IMPLANT
TOWEL OR 17X24 6PK STRL BLUE (TOWEL DISPOSABLE) ×3 IMPLANT
TRAY FOLEY CATH SILVER 14FR (SET/KITS/TRAYS/PACK) ×3 IMPLANT

## 2016-04-28 NOTE — Anesthesia Procedure Notes (Signed)
Spinal Patient location during procedure: OR Start time: 04/28/2016 11:37 AM Staffing Anesthesiologist: Mal AmabileFOSTER, Jelisa Preston Performed by: anesthesiologist  Preanesthetic Checklist Completed: patient identified, site marked, surgical consent, pre-op evaluation, timeout performed, IV checked, risks and benefits discussed and monitors and equipment checked Spinal Block Patient position: sitting Prep: site prepped and draped and DuraPrep Patient monitoring: cardiac monitor, continuous pulse ox, blood pressure and heart rate Approach: midline Location: L3-4 Injection technique: catheter Needle Needle type: Tuohy and Sprotte  Needle gauge: 24 G Needle length: 12.7 cm Needle insertion depth: 8 cm Catheter type: closed end flexible Catheter size: 19 g Catheter at skin depth: 13 cm Assessment Sensory level: T4 Events: paresthesia Additional Notes Epidural performed with 17ga Touhy needle LOR with air. SAB performed through the epidural needle. CSF clear, free flow, transient paresthesia right leg. LA+ Narcotics injected. Spinal needle withdrawn and epidural catheter threaded 5cm into the epidural space. Epidural needle then withdrawn and sterile dressing applied. Patient placed supine with LUD. Patient tolerated the procedure well. Adequate sensory level.

## 2016-04-28 NOTE — Anesthesia Preprocedure Evaluation (Addendum)
Anesthesia Evaluation  Patient identified by MRN, date of birth, ID band Patient awake    Reviewed: Allergy & Precautions, NPO status , Patient's Chart, lab work & pertinent test results  Airway Mallampati: III   Neck ROM: Full    Dental no notable dental hx. (+) Teeth Intact   Pulmonary former smoker,    Pulmonary exam normal breath sounds clear to auscultation       Cardiovascular negative cardio ROS Normal cardiovascular exam Rhythm:Regular Rate:Normal  Peripheral edema   Neuro/Psych negative neurological ROS  negative psych ROS   GI/Hepatic Neg liver ROS, GERD  Medicated and Controlled,  Endo/Other  Morbid obesity  Renal/GU Renal diseaseHx/o renal calculus  negative genitourinary   Musculoskeletal   Abdominal (+) + obese,   Peds  Hematology  (+) anemia ,   Anesthesia Other Findings Peripheral edema 2+  Reproductive/Obstetrics                            Anesthesia Physical Anesthesia Plan  ASA: III  Anesthesia Plan: Combined Spinal and Epidural   Post-op Pain Management:    Induction:   Airway Management Planned: Natural Airway  Additional Equipment:   Intra-op Plan:   Post-operative Plan:   Informed Consent: I have reviewed the patients History and Physical, chart, labs and discussed the procedure including the risks, benefits and alternatives for the proposed anesthesia with the patient or authorized representative who has indicated his/her understanding and acceptance.   Dental advisory given  Plan Discussed with: Anesthesiologist, CRNA and Surgeon  Anesthesia Plan Comments:         Anesthesia Quick Evaluation

## 2016-04-28 NOTE — Op Note (Signed)
Cesarean Section Procedure Note  Indications: P5 at 3139 1/7wks admitted for repeat c-section and sterilization  Pre-operative Diagnosis: Prior Cesarean Section, Desire for surgical Sterilization   Post-operative Diagnosis: Prior Cesarean Section, Desire for surgical Sterilization  Procedure: REPEAT LOW TRANSVERSE CESAREAN SECTION WITH BILATERAL TUBAL LIGATION  Surgeon: Osborn CohoAngela Mckensi Redinger, MD    Assistants: Jinger Neighborsina Friedman, RNFA  Anesthesia: Regional  Anesthesiologist: Mal AmabileMichael Foster, MD   Procedure Details  The patient was taken to the operating room after the risks, benefits, complications, treatment options, and expected outcomes were discussed with the patient.  The patient concurred with the proposed plan, giving informed consent which was signed and witnessed. The patient was taken to Operating Room C-Section Suite, identified as Charna ArcherAutumn J Freyre and the procedure verified as C-Section Delivery. A Time Out was held and the above information confirmed.  After induction of anesthesia by obtaining a surgical level via the spinal, the patient was prepped and draped in the usual sterile manner. A Pfannenstiel skin incision was made and carried down through the subcutaneous tissue to the underlying layer of fascia.  The fascia was incised bilaterally and extended transversely bilaterally with the Mayo scissors. Kocher clamps were placed on the inferior aspect of the fascial incision and the underlying rectus muscle was separated from the fascia. The same was done on the superior aspect of the fascial incision.  The peritoneum was identified, entered bluntly and extended manually. An Alexis self-retaining retractor was placed.  The utero-vesical peritoneal reflection was incised transversely and the bladder flap was bluntly freed from the lower uterine segment. A low transverse uterine incision was made with the scalpel and extended bilaterally with the bandage scissors.  The infant was delivered in vertex  position without difficulty. After the umbilical cord was clamped and cut, the infant was handed to the awaiting pediatricians.  Cord blood was obtained for evaluation.  The placenta was removed intact and appeared to be within normal limits. The uterus was cleared of all clots and debris. A left extension of the uterine incision was noted and behind the bladder and repaired from the inside of the uterus with 2-0 vicryl.  The primary uterine incision was closed with running interlocking sutures of 0 Vicryl and a second imbricating layer was performed as well.   Bilateral tubes and ovaries appeared to be within normal limits.  Good hemostasis was noted.  Copious irrigation was performed until clear.  The left fallopian tube was grasped in the midportion with a babcock after carrying it out to its fimbriated end and ligated with two 2-0 plain ties.  The tube was excised and the remaining pedicle cauterized with the bovie. The same was done on the contralateral side.  The peritoneum was repaired with 2-0 chromic via a running suture.  The fascia was reapproximated with a running suture of 0 Vicryl.  The skin was reapproximated with a subcuticular suture of 3-0 monocryl.  Steristrips were applied.  Instrument, sponge, and needle counts were correct prior to abdominal closure and at the conclusion of the case.  The patient was awaiting transfer to the recovery room in good condition.  Findings: Live female infant with Apgars 8 at one minute and 9 at five minutes.  Normal appearing bilateral ovaries and fallopian tubes were noted.  Estimated Blood Loss:  700 ml         Drains: foley to gravity 900 cc         Total IV Fluids: 2400 ml  Specimens to Pathology: Bilateral Portion of Tubes         Complications:  None; patient tolerated the procedure well.         Disposition: PACU - hemodynamically stable.         Condition: stable  Attending Attestation: I performed the procedure.

## 2016-04-28 NOTE — Addendum Note (Signed)
Addendum  created 04/28/16 1657 by Mal AmabileMichael Kalil Woessner, MD   Modules edited: Orders, PRL Based Order Sets
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
## 2016-04-28 NOTE — Lactation Note (Signed)
This note was copied from a baby's chart. Lactation Consultation Note  Patient Name: Girl Hector Brunswickutumn Abend Today's Date: 04/28/2016 Reason for consult: Initial assessment Encouraged to BF with feeding ques, Lactation brochure left for review, advised of OP services and support group. Mom experienced BF, denies questions/concerns.   Maternal Data Formula Feeding for Exclusion: No Has patient been taught Hand Expression?: No (Exp BF, Mom reports she knows how to hand express) Does the patient have breastfeeding experience prior to this delivery?: Yes  Feeding Feeding Type: Breast Fed  LATCH Score/Interventions Latch: Grasps breast easily, tongue down, lips flanged, rhythmical sucking.  Audible Swallowing: A few with stimulation  Type of Nipple: Everted at rest and after stimulation  Comfort (Breast/Nipple): Soft / non-tender     Hold (Positioning): No assistance needed to correctly position infant at breast.  LATCH Score: 9  Lactation Tools Discussed/Used WIC Program: Yes   Consult Status Consult Status: Follow-up Date: 04/29/16 Follow-up type: In-patient    Alfred LevinsGranger, Lynasia Meloche Ann 04/28/2016, 5:38 PM

## 2016-04-28 NOTE — Anesthesia Postprocedure Evaluation (Signed)
Anesthesia Post Note  Patient: [name]  Procedure(s) Performed: Procedure(s) (LRB): CESAREAN SECTION WITH BILATERAL TUBAL LIGATION (Bilateral)  Patient location during evaluation: Mother Baby Anesthesia Type: Epidural Level of consciousness: awake Pain management: satisfactory to patient Vital Signs Assessment: post-procedure vital signs reviewed and stable Respiratory status: spontaneous breathing Cardiovascular status: stable Anesthetic complications: no     Last Vitals:  Filed Vitals:   04/28/16 1632 04/28/16 1720  BP: 102/62 102/58  Pulse: 62 71  Temp: 35.6 C 36.5 C  Resp: 16 15    Last Pain:  Filed Vitals:   04/28/16 1722  PainSc: 7    Pain Goal: Patients Stated Pain Goal: 4 (04/28/16 1717)               Cephus ShellingBURGER,Tristram Milian

## 2016-04-28 NOTE — Transfer of Care (Signed)
Immediate Anesthesia Transfer of Care Note  Patient: [name]  Procedure(s) Performed: Procedure(s) with comments: CESAREAN SECTION WITH BILATERAL TUBAL LIGATION (Bilateral) - request RNFA  Patient Location: PACU  Anesthesia Type:Spinal  Level of Consciousness: awake  Airway & Oxygen Therapy: Patient Spontanous Breathing  Post-op Assessment: Report given to RN  Post vital signs: Reviewed and stable  Last Vitals:  Filed Vitals:   04/28/16 0900  BP: 123/66  Pulse: 85  Temp: 36.7 C  Resp: 18    Last Pain: There were no vitals filed for this visit.    Patients Stated Pain Goal: 3 (04/28/16 0900)  Complications: No apparent anesthesia complications

## 2016-04-28 NOTE — Anesthesia Postprocedure Evaluation (Signed)
Anesthesia Post Note  Patient: [name]  Procedure(s) Performed: Procedure(s) (LRB): CESAREAN SECTION WITH BILATERAL TUBAL LIGATION (Bilateral)  Patient location during evaluation: PACU Anesthesia Type: Combined Spinal/Epidural Level of consciousness: awake and alert and oriented Pain management: pain level controlled Vital Signs Assessment: post-procedure vital signs reviewed and stable Respiratory status: spontaneous breathing, nonlabored ventilation and respiratory function stable Cardiovascular status: blood pressure returned to baseline and stable Postop Assessment: no signs of nausea or vomiting, no headache, no backache, spinal receding and patient able to bend at knees Anesthetic complications: no     Last Vitals:  Filed Vitals:   04/28/16 1402 04/28/16 1415  BP: 119/74 108/45  Pulse: 65 87  Temp:    Resp: 13 27    Last Pain:  Filed Vitals:   04/28/16 1422  PainSc: 6    Pain Goal: Patients Stated Pain Goal: 3 (04/28/16 0900)               Taaj Hurlbut A.

## 2016-04-28 NOTE — Addendum Note (Signed)
Addendum  created 04/28/16 1733 by Algis GreenhouseLinda A Aleen Marston, CRNA   Modules edited: Clinical Notes   Clinical Notes:  File: [id number]

## 2016-04-29 LAB — RPR: RPR Ser Ql: NONREACTIVE

## 2016-04-29 LAB — CBC
HCT: 27.7 % — ABNORMAL LOW (ref 36.0–46.0)
Hemoglobin: 9.1 g/dL — ABNORMAL LOW (ref 12.0–15.0)
MCH: 26 pg (ref 26.0–34.0)
MCHC: 32.9 g/dL (ref 30.0–36.0)
MCV: 79.1 fL (ref 78.0–100.0)
PLATELETS: 167 10*3/uL (ref 150–400)
RBC: 3.5 MIL/uL — AB (ref 3.87–5.11)
RDW: 14.4 % (ref 11.5–15.5)
WBC: 10 10*3/uL (ref 4.0–10.5)

## 2016-04-29 NOTE — Progress Notes (Signed)
Subjective: Postpartum Day 1: Cesarean Delivery Patient reports that pain is well-managed.Lochia normal.  Ambulating, voiding, tolerating diet as ordered without difficulty. Normal flatus.  Absent bowel movement. Breastfeeding baby [place]  Objective: Vital signs in last 24 hours: Temp:  [96 F (35.6 C)-98.5 F (36.9 C)] 97.7 F (36.5 C) (07/04 0615) Pulse Rate:  [59-88] 65 (07/04 0615) Resp:  [13-28] 18 (07/04 0615) BP: (93-123)/(45-83) 94/50 mmHg (07/04 0615) SpO2:  [93 %-100 %] 96 % (07/04 0615) Weight:  [238 lb (107.956 kg)] 238 lb (107.956 kg) (07/03 0900)  Physical Exam:  General: alert Lochia: appropriate Uterine Fundus: firm and appropriately tender Incision: dressing dry and clean DVT Evaluation: No evidence of DVT seen on physical exam. Edema 2+   Recent Labs  04/28/16 0855 04/29/16 0500  HGB 10.3* 9.1*  HCT 31.2* 27.7*    Assessment/Plan: Status post Cesarean section. Doing well postoperatively.  Continue current care. Anticipate discharge tomorrow or 05/01/16  Rafael Salway A 04/29/2016, 8:09 AM
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
## 2016-04-29 NOTE — Progress Notes (Signed)
UR chart review completed.  

## 2016-04-29 NOTE — Lactation Note (Addendum)
This note was copied from a baby's chart. Lactation Consultation Note  Baby 28 hours old and has been sleepy.  Loretta Chapelllen RN reminded mother earlier today to wake baby to feed. Upon entering mother states she just breastfed for 20 min. Baby cueing.  Suggested baby latch on other breast. Observed off and on latch for 5 min and mother put pacifier in baby's mouth.  Reminded mother to breastfeed on both breasts. Pacifier use not recommended at this time.  Mom encouraged to feed baby 8-12 times/24 hours and with feeding cues.  Encouraged mother to do STS and undress baby for feedings.  Wake baby if needed. Provided mother with hand pump.   Patient Name: [name] WUJWJ'XToday's Date: 04/29/2016 Reason for consult: Follow-up assessment   Maternal Data    Feeding Feeding Type: Breast Fed Length of feed: 5 min  LATCH Score/Interventions Latch: Repeated attempts needed to sustain latch, nipple held in mouth throughout feeding, stimulation needed to elicit sucking reflex. (came off and on, mother states she is not hungry)  Audible Swallowing: A few with stimulation  Type of Nipple: Everted at rest and after stimulation  Comfort (Breast/Nipple): Soft / non-tender     Hold (Positioning): No assistance needed to correctly position infant at breast.  LATCH Score: 8  Lactation Tools Discussed/Used     Consult Status      Dahlia ByesBerkelhammer, Ruth [facility] 04/29/2016, 4:29 PM

## 2016-04-30 NOTE — Lactation Note (Signed)
This note was copied from a baby's chart. Lactation Consultation Note  Patient Name: [name] ZOXWR'UToday's Date: 04/30/2016 Reason for consult: Follow-up assessment  Mom c/o sore nipples (L is more sore than R). L nipple noted to have mild scabbing on tip and superficial cracking at base of nipple. Comfort Gels provided w/instructions for use. Mom has my # to call for assist w/next feeding & to assess how we can improve latch. Mom nursed her previous 5 children for about 1 year each.  Lurline HareRichey, Aloni Chuang [facility] 04/30/2016, 8:13 PM

## 2016-04-30 NOTE — Progress Notes (Signed)
Subjective: Postpartum Day 2: Cesarean Delivery and BTL Patient reports tolerating PO, + flatus and no problems voiding.  Reports pain on the right side of incision but hasn't been taking much pain meds.  Breast feeding  Objective: Vital signs in last 24 hours: Temp:  [98.4 F (36.9 C)-98.5 F (36.9 C)] 98.5 F (36.9 C) (07/05 0615) Pulse Rate:  [77-78] 78 (07/05 0615) Resp:  [17-18] 18 (07/05 0615) BP: (109-116)/(67-71) 109/67 mmHg (07/05 0615)  Physical Exam:  General: alert and no distress Lochia: appropriate Uterine Fundus: firm Incision: dressing c/d/i DVT Evaluation: No evidence of DVT seen on physical exam.   Recent Labs  04/28/16 0855 04/29/16 0500  HGB 10.3* 9.1*  HCT 31.2* 27.7*    Assessment/Plan: Status post Cesarean section. Doing well postoperatively.  Continue current care. Doing well Anticipate d/c tomorrow.  Carlisa Eble Y 04/30/2016, 11:28 AM

## 2016-04-30 NOTE — Lactation Note (Signed)
This note was copied from a baby's chart. Lactation Consultation Note  Patient Name: [name] [id number] Date: 04/30/2016  Mom called out for assist. "[name]" latched w/ease. Swallows were frequent & easily detectable. Mom felt improved comfort with latch when I showed her how to lower [name]'s chin slightly to widen her gape.   Mom shown how to assemble & use hand pump that was included in pump kit previously.  Mom is pleased w/the Comfort Gels that were provided earlier.    Matthias Hughs [facility] 04/30/2016, 9:54 PM

## 2016-05-01 MED ORDER — IBUPROFEN 600 MG PO TABS: 600.0000 mg | ORAL_TABLET | Freq: Four times a day (QID) | ORAL | Status: DC

## 2016-05-01 MED ORDER — OXYCODONE-ACETAMINOPHEN 5-325 MG PO TABS: 1.0000 | ORAL_TABLET | ORAL | Status: DC | PRN

## 2016-05-01 NOTE — Discharge Instructions (Signed)

## 2016-05-01 NOTE — Discharge Summary (Signed)
  Obstetric Discharge Summary  Reason for Admission: cesarean section on 04/28/16 Prenatal Procedures: none Intrapartum Procedures: cesarean: low cervical, transverse and tubal ligation by Dr Su Hiltoberts on 04/28/16 Postpartum Procedures: none Complications-Operative and Postpartum: none  HEMOGLOBIN  Date Value Ref Range Status  04/29/2016 9.1* 12.0 - 15.0 g/dL Final   HCT  Date Value Ref Range Status  04/29/2016 27.7* 36.0 - 46.0 % Final    Discharge Diagnoses: Term Pregnancy-delivered  Physical exam:   General: normal Lochia: appropriate Uterine Fundus: 0/2 firm non-tender Wound dressing dry and clean  Extremities: No evidence of DVT seen on physical exam. Edema minimal   Hospital course: uncomplicated  Date: 05/01/2016 Activity: unrestricted Diet: routine Medications: Ibuprofen and Percocet Condition: stable  Breastfeeding:   Yes.   Contraception:  Postpartum Sterilization  Instructions: refer to practice specific booklet Discharge to: home   Newborn Data:   Baby female Name: [name]  [name] A MD 05/01/2016, 12:17 PM

## 2016-05-01 NOTE — Lactation Note (Signed)
This note was copied from a baby's chart. Lactation Consultation Note  Patient Name: Girl Hector Brunswickutumn Palma VWUJW'JToday's Date: 05/01/2016 Reason for consult: Follow-up assessment;Infant weight loss;Other (Comment) (7% weight loss , MBU RN aware to document tota;l time for feeding )  Baby is 4569 hours old. Bili at 59 hours old - 9. Baby has been exclusively breast fed since birth. Mom reports her breast are getting fuller. @ consult mom latched  Baby independently and baby noted to have multiply swallows, Latch score of 10 , and depth at the breast , per mom comfortable.  Per mom comfort gels are making sore ness better. LC recommended if sore ness isn't cleared up by 4 days to call for LC O/p appt.  LC reviewed basics , sore nipple and engorgement prevention and tx reviewed. Per mom has a pump at home, also the [facility]BP [facility] up , [facility]  Mother informed of post-discharge support and given phone number to the lactation department, including services for phone call assistance; out-patient appointments; and breastfeeding support group. List of other breastfeeding resources in the community given in the handout. Encouraged mother to call for problems or concerns related to breastfeeding.   Maternal Data Has patient been taught Hand Expression?:  (several large drops prior to latch )  Feeding Feeding Type: Breast Fed Length of feed:  (multiply swallows noted , ibcreased w / breast compressions )  LATCH Score/Interventions Latch: Grasps breast easily, tongue down, lips flanged, rhythmical sucking.  Audible Swallowing: Spontaneous and intermittent  Type of Nipple: Everted at rest and after stimulation  Comfort (Breast/Nipple): Filling, red/small blisters or bruises, mild/mod discomfort  Problem noted: Filling  Hold (Positioning): Assistance needed to correctly position infant at breast and maintain latch. Intervention(s): Breastfeeding basics reviewed;Support Pillows;Position options;Skin to  skin  LATCH Score: 8  Lactation Tools Discussed/Used Breast pump type:  (per mom has only pumped x 1 in the last 24 hours )   Consult Status Consult Status: Complete Date: 05/01/16    Kathrin Greathouseorio, Lacole Komorowski Ann 05/01/2016, 9:55 AM

## 2016-05-02 LAB — TYPE AND SCREEN
ABO/RH(D): O POS
ANTIBODY SCREEN: NEGATIVE
UNIT DIVISION: 0
UNIT DIVISION: 0

## 2016-05-23 ENCOUNTER — Encounter (HOSPITAL_COMMUNITY): Payer: Self-pay | Admitting: Certified Nurse Midwife

## 2016-05-23 ENCOUNTER — Inpatient Hospital Stay (HOSPITAL_COMMUNITY)
Admission: AD | Admit: 2016-05-23 | Discharge: 2016-05-23 | Disposition: A | Discharge status: Home / Self Care | Payer: Medicaid Other | Source: Ambulatory Visit | Attending: Obstetrics and Gynecology | Admitting: Obstetrics and Gynecology

## 2016-05-23 DIAGNOSIS — Z880 Allergy status to penicillin: Secondary | ICD-10-CM | POA: Diagnosis not present

## 2016-05-23 DIAGNOSIS — Z9889 Other specified postprocedural states: Secondary | ICD-10-CM | POA: Insufficient documentation

## 2016-05-23 DIAGNOSIS — IMO0001 Reserved for inherently not codable concepts without codable children: Secondary | ICD-10-CM

## 2016-05-23 DIAGNOSIS — T8189XA Other complications of procedures, not elsewhere classified, initial encounter: Secondary | ICD-10-CM | POA: Diagnosis not present

## 2016-05-23 DIAGNOSIS — Z87891 Personal history of nicotine dependence: Secondary | ICD-10-CM | POA: Diagnosis not present

## 2016-05-23 DIAGNOSIS — O860 Infection of obstetric surgical wound: Secondary | ICD-10-CM | POA: Diagnosis not present

## 2016-05-23 DIAGNOSIS — Z9851 Tubal ligation status: Secondary | ICD-10-CM | POA: Insufficient documentation

## 2016-05-23 DIAGNOSIS — T814XXA Infection following a procedure, initial encounter: Secondary | ICD-10-CM

## 2016-05-23 DIAGNOSIS — Z98891 History of uterine scar from previous surgery: Secondary | ICD-10-CM

## 2016-05-23 MED ORDER — CLINDAMYCIN HCL 300 MG PO CAPS: 300.0000 mg | ORAL_CAPSULE | Freq: Three times a day (TID) | ORAL | 0 refills | Status: DC
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
## 2016-05-23 NOTE — Discharge Instructions (Signed)
Cesarean Delivery, Care After  Refer to this sheet in the next few weeks. These instructions provide you with information on caring for yourself after your procedure. Your health care provider may also give you specific instructions. Your treatment has been planned according to current medical practices, but problems sometimes occur. Call your health care provider if you have any problems or questions after you go home.  HOME CARE INSTRUCTIONS   Only take over-the-counter or prescription medications as directed by your health care provider.   Do not drink alcohol, especially if you are breastfeeding or taking medication to relieve pain.   Do not chew or smoke tobacco.   Continue to use good perineal care. Good perineal care includes:    Wiping your perineum from front to back.    Keeping your perineum clean.   Check your surgical cut (incision) daily for increased redness, drainage, swelling, or separation of skin.   Clean your incision gently with soap and water every day, and then pat it dry. If your health care provider says it is okay, leave the incision uncovered. Use a bandage (dressing) if the incision is draining fluid or appears irritated. If the adhesive strips across the incision do not fall off within 7 days, carefully peel them off.   Hug a pillow when coughing or sneezing until your incision is healed. This helps to relieve pain.   Do not use tampons or douche until your health care provider says it is okay.   Shower, wash your hair, and take tub baths as directed by your health care provider.   Wear a well-fitting bra that provides breast support.   Limit wearing support panties or control-top hose.   Drink enough fluids to keep your urine clear or pale yellow.   Eat high-fiber foods such as whole grain cereals and breads, brown rice, beans, and fresh fruits and vegetables every day. These foods may help prevent or relieve constipation.   Resume activities such as climbing stairs,  driving, lifting, exercising, or traveling as directed by your health care provider.   Talk to your health care provider about resuming sexual activities. This is dependent upon your risk of infection, your rate of healing, and your comfort and desire to resume sexual activity.   Try to have someone help you with your household activities and your newborn for at least a few days after you leave the hospital.   Rest as much as possible. Try to rest or take a nap when your newborn is sleeping.   Increase your activities gradually.   Keep all of your scheduled postpartum appointments. It is very important to keep your scheduled follow-up appointments. At these appointments, your health care provider will be checking to make sure that you are healing physically and emotionally.  SEEK MEDICAL CARE IF:    You are passing large clots from your vagina. Save any clots to show your health care provider.   You have a foul smelling discharge from your vagina.   You have trouble urinating.   You are urinating frequently.   You have pain when you urinate.   You have a change in your bowel movements.   You have increasing redness, pain, or swelling near your incision.   You have pus draining from your incision.   Your incision is separating.   You have painful, hard, or reddened breasts.   You have a severe headache.   You have blurred vision or see spots.   You feel sad   or depressed.   You have thoughts of hurting yourself or your newborn.   You have questions about your care, the care of your newborn, or medications.   You are dizzy or light-headed.   You have a rash.   You have pain, redness, or swelling at the site of the removed intravenous access (IV) tube.   You have nausea or vomiting.   You stopped breastfeeding and have not had a menstrual period within 12 weeks of stopping.   You are not breastfeeding and have not had a menstrual period within 12 weeks of delivery.   You have a fever.  SEEK  IMMEDIATE MEDICAL CARE IF:   You have persistent pain.   You have chest pain.   You have shortness of breath.   You faint.   You have leg pain.   You have stomach pain.   Your vaginal bleeding saturates 2 or more sanitary pads in 1 hour.  MAKE SURE YOU:    Understand these instructions.   Will watch your condition.   Will get help right away if you are not doing well or get worse.     This information is not intended to replace advice given to you by your health care provider. Make sure you discuss any questions you have with your health care provider.     Document Released: 07/05/2002 Document Revised: 11/03/2014 Document Reviewed: 06/09/2012  Elsevier Interactive Patient Education 2016 [employer]

## 2016-05-23 NOTE — MAU Note (Signed)
Pt states she has new onset drainage from her C-section incision from 04/28/2016. Pt denies fever. Pt states there is one area that is slightly tender. No other complaints.

## 2017-08-14 ENCOUNTER — Inpatient Hospital Stay (HOSPITAL_COMMUNITY): Payer: Self-pay

## 2017-08-14 ENCOUNTER — Encounter (HOSPITAL_COMMUNITY): Payer: Self-pay | Admitting: *Deleted

## 2017-08-14 ENCOUNTER — Inpatient Hospital Stay (HOSPITAL_COMMUNITY)
Admission: AD | Admit: 2017-08-14 | Discharge: 2017-08-14 | Disposition: A | Discharge status: Home / Self Care | Payer: Self-pay | Source: Ambulatory Visit | Attending: Obstetrics and Gynecology | Admitting: Obstetrics and Gynecology

## 2017-08-14 DIAGNOSIS — R1031 Right lower quadrant pain: Secondary | ICD-10-CM | POA: Insufficient documentation

## 2017-08-14 DIAGNOSIS — Z87891 Personal history of nicotine dependence: Secondary | ICD-10-CM | POA: Insufficient documentation

## 2017-08-14 DIAGNOSIS — Z880 Allergy status to penicillin: Secondary | ICD-10-CM | POA: Insufficient documentation

## 2017-08-14 DIAGNOSIS — Z9851 Tubal ligation status: Secondary | ICD-10-CM | POA: Insufficient documentation

## 2017-08-14 DIAGNOSIS — N912 Amenorrhea, unspecified: Secondary | ICD-10-CM | POA: Insufficient documentation

## 2017-08-14 LAB — CBC WITH DIFFERENTIAL/PLATELET
BASOS PCT: 0 %
Basophils Absolute: 0 10*3/uL (ref 0.0–0.1)
EOS ABS: 0.2 10*3/uL (ref 0.0–0.7)
EOS PCT: 3 %
HCT: 36.9 % (ref 36.0–46.0)
HEMOGLOBIN: 12.1 g/dL (ref 12.0–15.0)
Lymphocytes Relative: 34 %
Lymphs Abs: 2.3 10*3/uL (ref 0.7–4.0)
MCH: 26.1 pg (ref 26.0–34.0)
MCHC: 32.8 g/dL (ref 30.0–36.0)
MCV: 79.7 fL (ref 78.0–100.0)
Monocytes Absolute: 0.3 10*3/uL (ref 0.1–1.0)
Monocytes Relative: 4 %
NEUTROS PCT: 59 %
Neutro Abs: 4.2 10*3/uL (ref 1.7–7.7)
Platelets: 249 10*3/uL (ref 150–400)
RBC: 4.63 MIL/uL (ref 3.87–5.11)
RDW: 14 % (ref 11.5–15.5)
WBC: 6.9 10*3/uL (ref 4.0–10.5)

## 2017-08-14 LAB — URINALYSIS, ROUTINE W REFLEX MICROSCOPIC
BACTERIA UA: NONE SEEN
Bilirubin Urine: NEGATIVE
Glucose, UA: >=500 mg/dL — AB
KETONES UR: 5 mg/dL — AB
Nitrite: NEGATIVE
PROTEIN: 100 mg/dL — AB
Specific Gravity, Urine: 1.025 (ref 1.005–1.030)
pH: 7 (ref 5.0–8.0)

## 2017-08-14 LAB — POCT PREGNANCY, URINE: PREG TEST UR: NEGATIVE

## 2017-08-14 LAB — GLUCOSE, CAPILLARY: GLUCOSE-CAPILLARY: 107 mg/dL — AB (ref 65–99)

## 2017-08-14 LAB — HCG, QUANTITATIVE, PREGNANCY: hCG, Beta Chain, Quant, S: <1 m[IU]/mL (ref <5)

## 2017-08-14 MED ORDER — NITROFURANTOIN MONOHYD MACRO 100 MG PO CAPS: 100.0000 mg | ORAL_CAPSULE | Freq: Two times a day (BID) | ORAL | 0 refills | Status: AC

## 2017-08-14 MED ORDER — MEDROXYPROGESTERONE ACETATE 10 MG PO TABS: 10.0000 mg | ORAL_TABLET | Freq: Every day | ORAL | 0 refills | Status: DC
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
## 2017-08-14 NOTE — MAU Note (Signed)
Pain started 2 days ago, cramping in RLQ.  No period for 2 months.  Has had tubes tied.  +HPT

## 2017-08-14 NOTE — Discharge Instructions (Signed)
Secondary Amenorrhea Secondary amenorrhea is the stopping of menstrual flow for 3-6 months in a female who has previously had periods. There are many possible causes. Most of these causes are not serious. Usually, treating the underlying problem causing the loss of menses will return your periods to normal. What are the causes? Some common and uncommon causes of not menstruating include:  Malnutrition.  Low blood sugar (hypoglycemia).  Polycystic ovary disease.  Stress or fear.  Breastfeeding.  Hormone imbalance.  Ovarian failure.  Medicines.  Extreme obesity.  Cystic fibrosis.  Low body weight or drastic weight reduction from any cause.  Early menopause.  Removal of ovaries or uterus.  Contraceptives.  Illness.  Long-term (chronic) illnesses.  Cushing syndrome.  Thyroid problems.  Birth control pills, patches, or vaginal rings for birth control.  What increases the risk? You may be at greater risk of secondary amenorrhea if:  You have a family history of this condition.  You have an eating disorder.  You do athletic training.  How is this diagnosed? A diagnosis is made by your health care provider taking a medical history and doing a physical exam. This will include a pelvic exam to check for problems with your reproductive organs. Pregnancy must be ruled out. Often, numerous blood tests are done to measure different hormones in the body. Urine testing may be done. Specialized exams (ultrasound, CT scan, MRI, or hysteroscopy) may have to be done as well as measuring the body mass index (BMI). How is this treated? Treatment depends on the cause of the amenorrhea. If an eating disorder is present, this can be treated with an adequate diet and therapy. Chronic illnesses may improve with treatment of the illness. Amenorrhea may be corrected with medicines, lifestyle changes, or surgery. If the amenorrhea cannot be corrected, it is sometimes possible to create a  false menstruation with medicines. Follow these instructions at home:  Maintain a healthy diet.  Manage weight problems.  Exercise regularly but not excessively.  Get adequate sleep.  Manage stress.  Be aware of changes in your menstrual cycle. Keep a record of when your periods occur. Note the date your period starts, how long it lasts, and any problems. Contact a health care provider if: Your symptoms do not get better with treatment. This information is not intended to replace advice given to you by your health care provider. Make sure you discuss any questions you have with your health care provider. Document Released: 11/24/2006 Document Revised: 03/20/2016 Document Reviewed: 03/31/2013 Elsevier Interactive Patient Education  2018 Elsevier Inc.  

## 2017-08-15 LAB — URINE CULTURE

## 2017-09-14 IMAGING — US US RENAL
1 series · 14 of 25 positions shown · non-contrast
Comparison: CT abdomen and pelvis July 22, 2015

CLINICAL DATA: Right flank pain

EXAM:
RENAL / URINARY TRACT ULTRASOUND COMPLETE

[Series 1: us renal · 0.23mm/px · 14 of 33 slices shown]
[im 1/33]
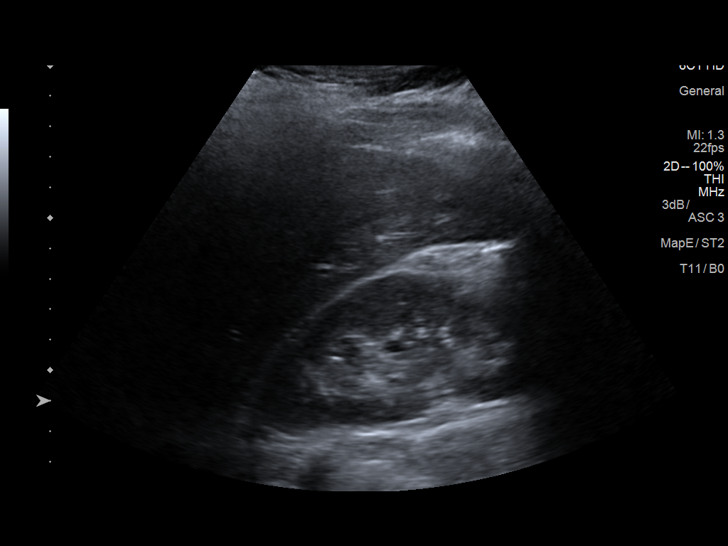
[im 3/33]
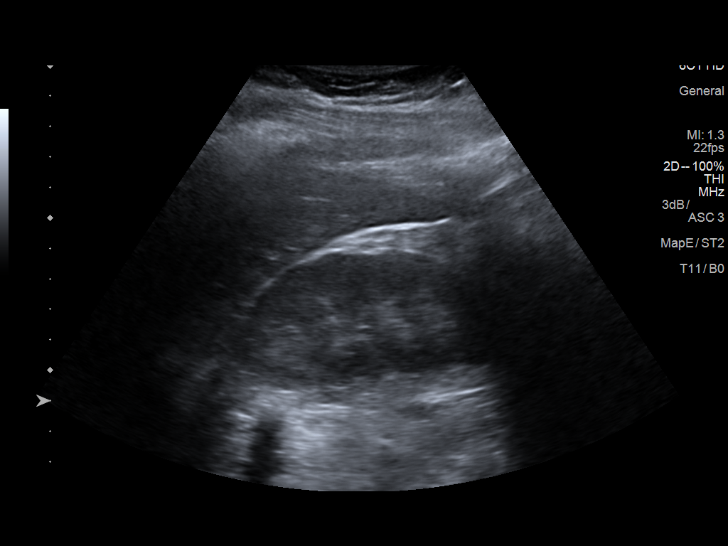
[im 6/33]
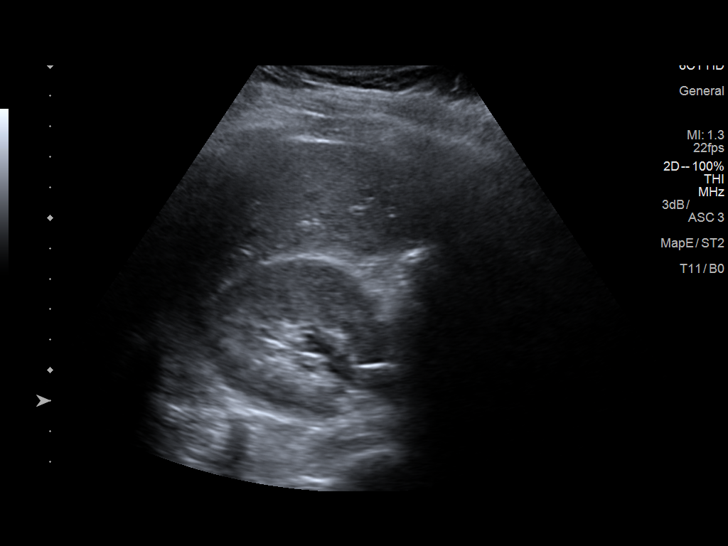
[im 9/33]
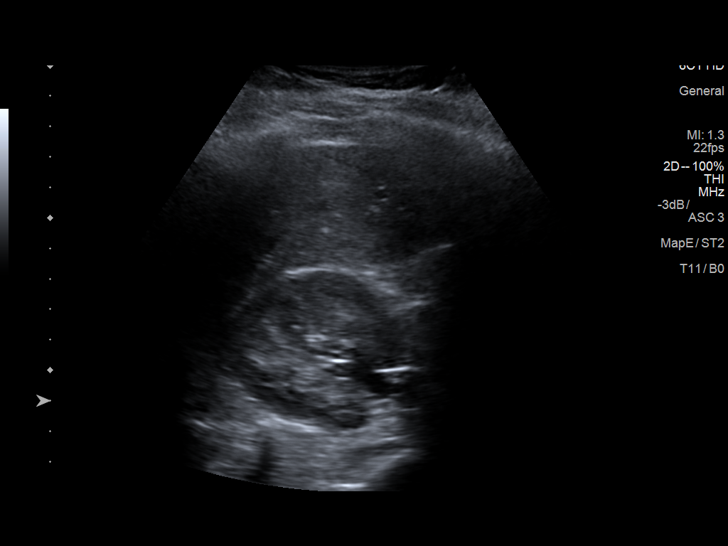
[im 11/33]
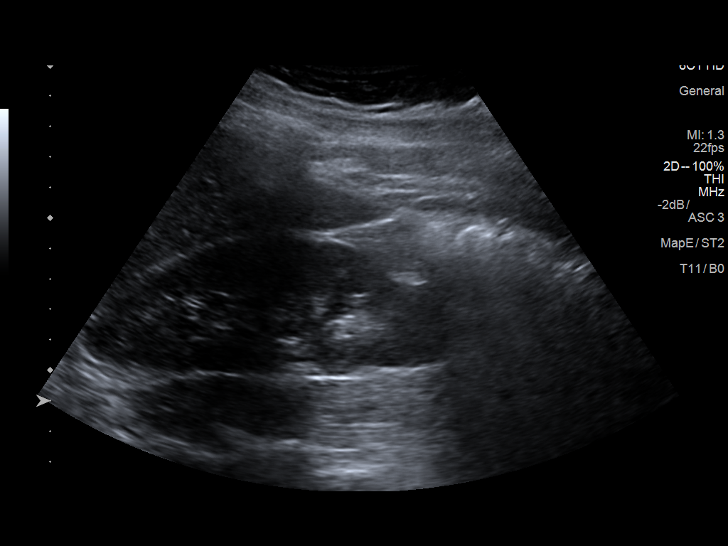
[im 13/33]
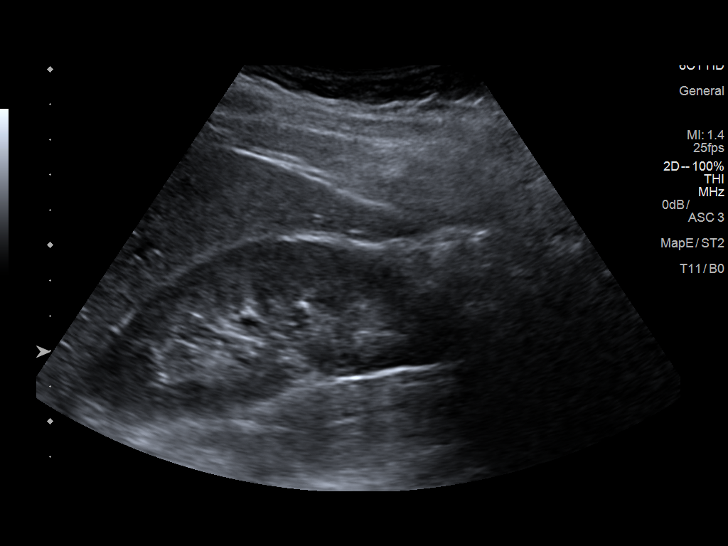
[im 15/33]
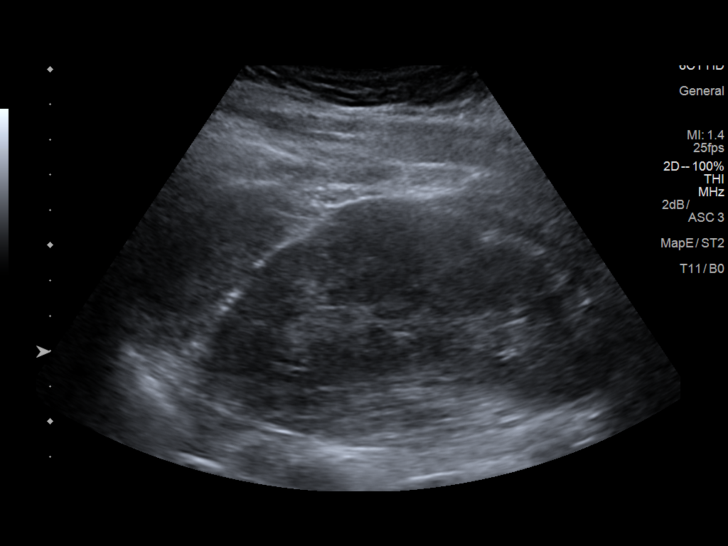
[im 18/33]
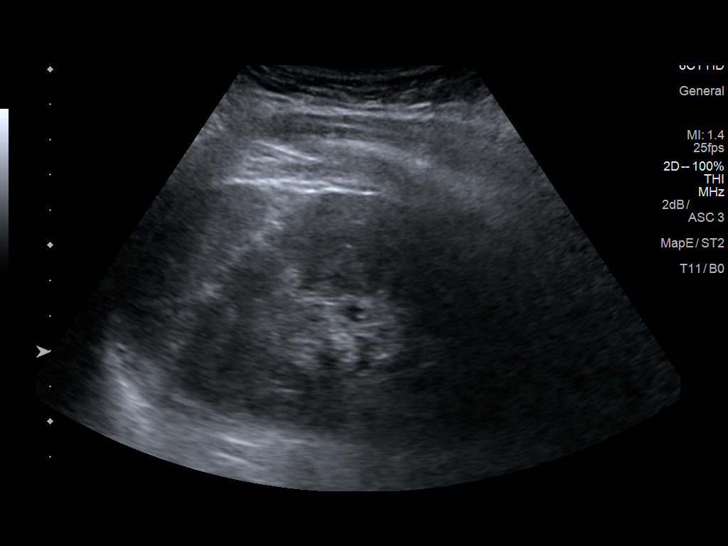
[im 21/33]
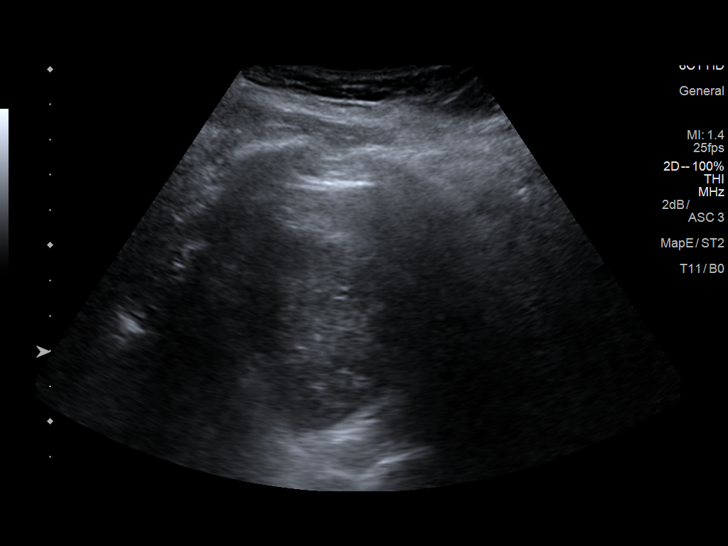
[im 22/33]
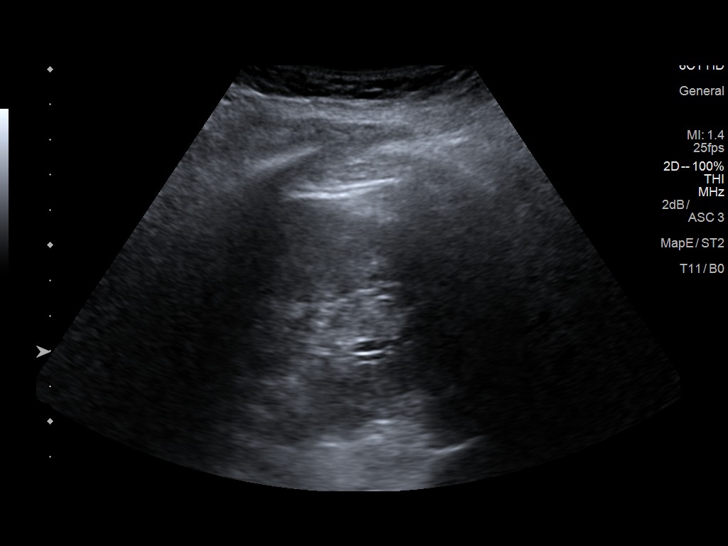
[im 25/33]
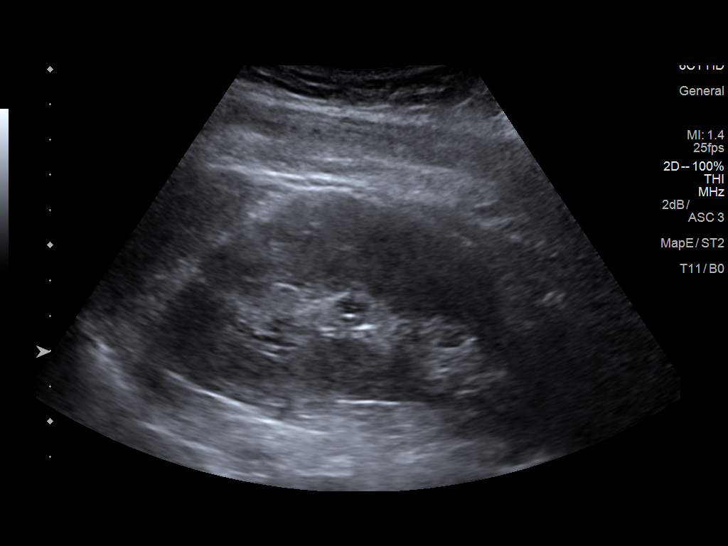
[im 27/33]
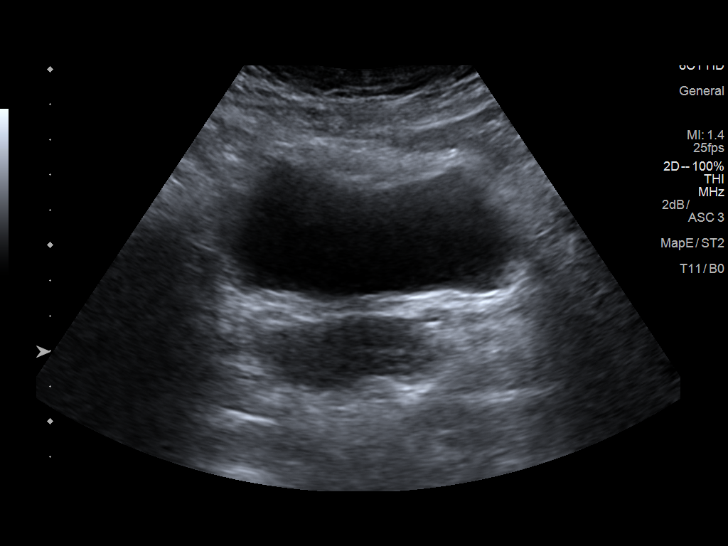
[im 30/33]
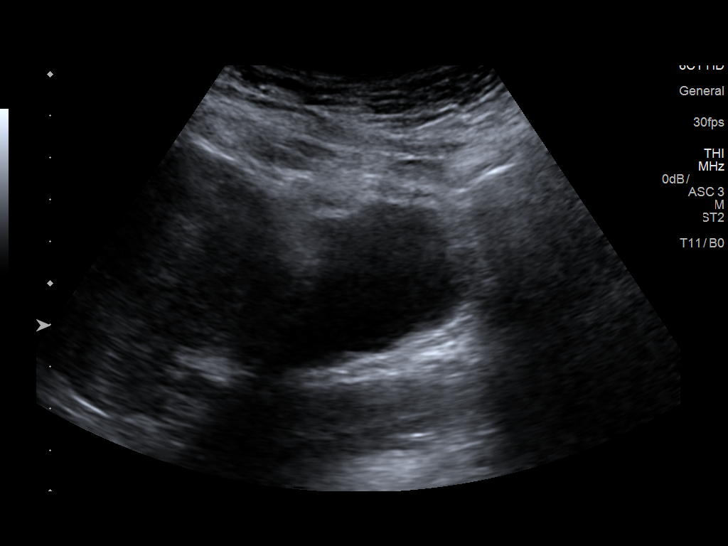
[im 33/33]
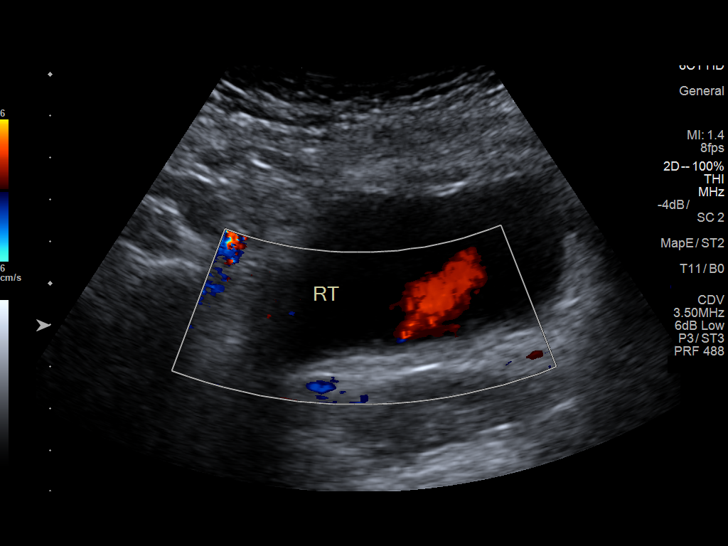

[14 of 25 positions shown; findings below may reference images not displayed]

FINDINGS: Right Kidney:

Length: 12.1 cm. Echogenicity and renal cortical thickness are
within normal limits. No mass, perinephric fluid, or hydronephrosis
visualized. No sonographically demonstrable calculus or
ureterectasis.

Left Kidney:

Length: 11.7 cm. Echogenicity and renal cortical thickness are
within normal limits. No mass, perinephric fluid, or hydronephrosis
visualized. No sonographically demonstrable calculus or
ureterectasis.

Bladder:

Appears normal for degree of bladder distention.
IMPRESSION: Study within normal limits.

## 2017-11-24 ENCOUNTER — Inpatient Hospital Stay (HOSPITAL_COMMUNITY)
Admission: AD | Admit: 2017-11-24 | Discharge: 2017-11-24 | Disposition: A | Discharge status: Home / Self Care | Payer: BLUE CROSS/BLUE SHIELD | Source: Ambulatory Visit | Attending: Obstetrics and Gynecology | Admitting: Obstetrics and Gynecology

## 2017-11-24 ENCOUNTER — Encounter (HOSPITAL_COMMUNITY): Payer: Self-pay | Admitting: *Deleted

## 2017-11-24 DIAGNOSIS — N911 Secondary amenorrhea: Secondary | ICD-10-CM

## 2017-11-24 DIAGNOSIS — Z87891 Personal history of nicotine dependence: Secondary | ICD-10-CM | POA: Insufficient documentation

## 2017-11-24 DIAGNOSIS — R109 Unspecified abdominal pain: Secondary | ICD-10-CM

## 2017-11-24 DIAGNOSIS — R1030 Lower abdominal pain, unspecified: Secondary | ICD-10-CM | POA: Insufficient documentation

## 2017-11-24 DIAGNOSIS — B9689 Other specified bacterial agents as the cause of diseases classified elsewhere: Secondary | ICD-10-CM

## 2017-11-24 DIAGNOSIS — Z880 Allergy status to penicillin: Secondary | ICD-10-CM | POA: Insufficient documentation

## 2017-11-24 DIAGNOSIS — N760 Acute vaginitis: Secondary | ICD-10-CM

## 2017-11-24 DIAGNOSIS — R319 Hematuria, unspecified: Secondary | ICD-10-CM

## 2017-11-24 LAB — CBC WITH DIFFERENTIAL/PLATELET
Basophils Absolute: 0 10*3/uL (ref 0.0–0.1)
Basophils Relative: 0 %
EOS PCT: 2 %
Eosinophils Absolute: 0.2 10*3/uL (ref 0.0–0.7)
HCT: 37.7 % (ref 36.0–46.0)
Hemoglobin: 12.6 g/dL (ref 12.0–15.0)
LYMPHS ABS: 2.4 10*3/uL (ref 0.7–4.0)
LYMPHS PCT: 31 %
MCH: 27.2 pg (ref 26.0–34.0)
MCHC: 33.4 g/dL (ref 30.0–36.0)
MCV: 81.3 fL (ref 78.0–100.0)
Monocytes Absolute: 0.4 10*3/uL (ref 0.1–1.0)
Monocytes Relative: 5 %
Neutro Abs: 4.6 10*3/uL (ref 1.7–7.7)
Neutrophils Relative %: 62 %
PLATELETS: 281 10*3/uL (ref 150–400)
RBC: 4.64 MIL/uL (ref 3.87–5.11)
RDW: 14.3 % (ref 11.5–15.5)
WBC: 7.5 10*3/uL (ref 4.0–10.5)

## 2017-11-24 LAB — URINALYSIS, ROUTINE W REFLEX MICROSCOPIC
BILIRUBIN URINE: NEGATIVE
Glucose, UA: NEGATIVE mg/dL
Ketones, ur: NEGATIVE mg/dL
LEUKOCYTES UA: NEGATIVE
NITRITE: NEGATIVE
PH: 6 (ref 5.0–8.0)
Protein, ur: NEGATIVE mg/dL
Specific Gravity, Urine: 1.019 (ref 1.005–1.030)

## 2017-11-24 LAB — WET PREP, GENITAL
Sperm: NONE SEEN
Trich, Wet Prep: NONE SEEN
Yeast Wet Prep HPF POC: NONE SEEN

## 2017-11-24 LAB — POCT PREGNANCY, URINE: Preg Test, Ur: NEGATIVE

## 2017-11-24 MED ORDER — METRONIDAZOLE 500 MG PO TABS: 500.0000 mg | ORAL_TABLET | Freq: Two times a day (BID) | ORAL | 0 refills | Status: DC

## 2017-11-24 MED ORDER — KETOROLAC TROMETHAMINE 60 MG/2ML IM SOLN
60.0000 mg | Freq: Once | INTRAMUSCULAR | Status: AC
  Administered 2017-11-24: 60 mg via INTRAMUSCULAR
  Filled 2017-11-24: qty 2

## 2017-11-24 MED ORDER — KETOROLAC TROMETHAMINE 10 MG PO TABS: 10.0000 mg | ORAL_TABLET | Freq: Four times a day (QID) | ORAL | 0 refills | Status: DC | PRN

## 2017-11-24 NOTE — MAU Note (Signed)
Patient c/o  +lower abdominal pain Constant and sharp in nature Rating pain 9/10 Last took tylenol at 3am with no relief Ongoing for past 3 days  LMP--irregular patient states Last "normal" cycle was 09/13/17  Denies vaginal bleeding or discharge.
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
## 2017-11-24 NOTE — Discharge Instructions (Signed)
In late 2019, the [facility] will be moving to the [facility] campus. At that time, the MAU (Maternity Admissions Unit), where you are being seen today, will no longer take care of non-pregnant patients. We strongly encourage you to find a doctor's office before that time, so that you can be seen with any GYN concerns, like vaginal discharge, urinary tract infection, etc.. in a timely manner.  In order to make an office visit more convenient, the Center for [facility] at [facility] will be offering evening hours with same-day appointments, walk-in appointments and scheduled appointments available during this time.  Center for [facility] @ [facility] Hours: Monday - 8am - 7:30 pm with walk-in between 4pm- 7:30 pm Tuesday - 8 am - 5 pm (starting 01/26/18 we will be open late and accepting walk-ins from 4pm - 7:30pm) Wednesday - 8 am - 5 pm (starting 04/28/18 we will be open late and accepting walk-ins from 4pm - 7:30pm) Thursday 8 am - 5 pm (starting 07/29/18 we will be open late and accepting walk-ins from 4pm - 7:30pm) Friday 8 am - 5 pm  For an appointment please call the Center for [facility] @ [facility] at [phone]  For urgent needs, [facility] is also available for management of urgent GYN complaints such as vaginal discharge or urinary tract infections.       Abdominal Pain, Adult Abdominal pain can be caused by many things. Often, abdominal pain is not serious and it gets better with no treatment or by being treated at home. However, sometimes abdominal pain is serious. Your health care provider will do a medical history and a physical exam to try to determine the cause of your abdominal pain. Follow these instructions at home:  Take over-the-counter and prescription medicines only as told by your health care provider. Do not take a laxative unless told by your health care provider.  Drink enough fluid to keep your urine  clear or pale yellow.  Watch your condition for any changes.  Keep all follow-up visits as told by your health care provider. This is important. Contact a health care provider if:  Your abdominal pain changes or gets worse.  You are not hungry or you lose weight without trying.  You are constipated or have diarrhea for more than 2-3 days.  You have pain when you urinate or have a bowel movement.  Your abdominal pain wakes you up at night.  Your pain gets worse with meals, after eating, or with certain foods.  You are throwing up and cannot keep anything down.  You have a fever. Get help right away if:  Your pain does not go away as soon as your health care provider told you to expect.  You cannot stop throwing up.  Your pain is only in areas of the abdomen, such as the right side or the left lower portion of the abdomen.  You have bloody or black stools, or stools that look like tar.  You have severe pain, cramping, or bloating in your abdomen.  You have signs of dehydration, such as: ? Dark urine, very little urine, or no urine. ? Cracked lips. ? Dry mouth. ? Sunken eyes. ? Sleepiness. ? Weakness. This information is not intended to replace advice given to you by your health care provider. Make sure you discuss any questions you have with your health care provider. Document Released: 07/23/2005 Document Revised: 05/02/2016 Document Reviewed: 03/26/2016 Elsevier Interactive Patient Education  2018 Elsevier Inc.   Bacterial Vaginosis Bacterial vaginosis is a vaginal infection that occurs when the normal balance of bacteria in the vagina is disrupted. It results from an overgrowth of certain bacteria. This is the most common vaginal infection among women ages 7-44. Because bacterial vaginosis increases your risk for STIs (sexually transmitted infections), getting treated can help reduce your risk for chlamydia, gonorrhea, herpes, and HIV (human immunodeficiency virus).  Treatment is also important for preventing complications in pregnant women, because this condition can cause an early (premature) delivery. What are the causes? This condition is caused by an increase in harmful bacteria that are normally present in small amounts in the vagina. However, the reason that the condition develops is not fully understood. What increases the risk? The following factors may make you more likely to develop this condition:  Having a new sexual partner or multiple sexual partners.  Having unprotected sex.  Douching.  Having an intrauterine device (IUD).  Smoking.  Drug and alcohol abuse.  Taking certain antibiotic medicines.  Being pregnant.  You cannot get bacterial vaginosis from toilet seats, bedding, swimming pools, or contact with objects around you. What are the signs or symptoms? Symptoms of this condition include:  Grey or white vaginal discharge. The discharge can also be watery or foamy.  A fish-like odor with discharge, especially after sexual intercourse or during menstruation.  Itching in and around the vagina.  Burning or pain with urination.  Some women with bacterial vaginosis have no signs or symptoms. How is this diagnosed? This condition is diagnosed based on:  Your medical history.  A physical exam of the vagina.  Testing a sample of vaginal fluid under a microscope to look for a large amount of bad bacteria or abnormal cells. Your health care provider may use a cotton swab or a small wooden spatula to collect the sample.  How is this treated? This condition is treated with antibiotics. These may be given as a pill, a vaginal cream, or a medicine that is put into the vagina (suppository). If the condition comes back after treatment, a second round of antibiotics may be needed. Follow these instructions at home: Medicines  Take over-the-counter and prescription medicines only as told by your health care provider.  Take or  use your antibiotic as told by your health care provider. Do not stop taking or using the antibiotic even if you start to feel better. General instructions  If you have a female sexual partner, tell her that you have a vaginal infection. She should see her health care provider and be treated if she has symptoms. If you have a female sexual partner, he does not need treatment.  During treatment: ? Avoid sexual activity until you finish treatment. ? Do not douche. ? Avoid alcohol as directed by your health care provider. ? Avoid breastfeeding as directed by your health care provider.  Drink enough water and fluids to keep your urine clear or pale yellow.  Keep the area around your vagina and rectum clean. ? Wash the area daily with warm water. ? Wipe yourself from front to back after using the toilet.  Keep all follow-up visits as told by your health care provider. This is important. How is this prevented?  Do not douche.  Wash the outside of your vagina with warm water only.  Use protection when having sex. This includes latex condoms and dental dams.  Limit how many sexual partners you have. To help prevent bacterial vaginosis, it is  best to have sex with just one partner (monogamous).  Make sure you and your sexual partner are tested for STIs.  Wear cotton or cotton-lined underwear.  Avoid wearing tight pants and pantyhose, especially during summer.  Limit the amount of alcohol that you drink.  Do not use any products that contain nicotine or tobacco, such as cigarettes and e-cigarettes. If you need help quitting, ask your health care provider.  Do not use illegal drugs. Where to find more information:  Centers for Disease Control and Prevention: SolutionApps.co.za  American Sexual Health Association (ASHA): www.ashastd.org  U.S. Department of Health and Health and safety inspector, Office on Women's Health: ConventionalMedicines.si or  http://www.anderson-williamson.info/ Contact a health care provider if:  Your symptoms do not improve, even after treatment.  You have more discharge or pain when urinating.  You have a fever.  You have pain in your abdomen.  You have pain during sex.  You have vaginal bleeding between periods. Summary  Bacterial vaginosis is a vaginal infection that occurs when the normal balance of bacteria in the vagina is disrupted.  Because bacterial vaginosis increases your risk for STIs (sexually transmitted infections), getting treated can help reduce your risk for chlamydia, gonorrhea, herpes, and HIV (human immunodeficiency virus). Treatment is also important for preventing complications in pregnant women, because the condition can cause an early (premature) delivery.  This condition is treated with antibiotic medicines. These may be given as a pill, a vaginal cream, or a medicine that is put into the vagina (suppository). This information is not intended to replace advice given to you by your health care provider. Make sure you discuss any questions you have with your health care provider. Document Released: 10/13/2005 Document Revised: 02/16/2017 Document Reviewed: 06/28/2016 Elsevier Interactive Patient Education  2018 [employer].   Hematuria, Adult Hematuria is blood in your urine. It can be caused by a bladder infection, kidney infection, prostate infection, kidney stone, or cancer of your urinary tract. Infections can usually be treated with medicine, and a kidney stone usually will pass through your urine. If neither of these is the cause of your hematuria, further workup to find out the reason may be needed. It is very important that you tell your health care provider about any blood you see in your urine, even if the blood stops without treatment or happens without causing pain. Blood in your urine that happens and then stops and then happens again can be a  symptom of a very serious condition. Also, pain is not a symptom in the initial stages of many urinary cancers. Follow these instructions at home:  Drink lots of fluid, 3-4 quarts a day. If you have been diagnosed with an infection, cranberry juice is especially recommended, in addition to large amounts of water.  Avoid caffeine, tea, and carbonated beverages because they tend to irritate the bladder.  Avoid alcohol because it may irritate the prostate.  Take all medicines as directed by your health care provider.  If you were prescribed an antibiotic medicine, finish it all even if you start to feel better.  If you have been diagnosed with a kidney stone, follow your health care provider's instructions regarding straining your urine to catch the stone.  Empty your bladder often. Avoid holding urine for long periods of time.  After a bowel movement, women should cleanse front to back. Use each tissue only once.  Empty your bladder before and after sexual intercourse if you are a female. Contact a health care provider if:  You develop back pain.  You have a fever.  You have a feeling of sickness in your stomach (nausea) or vomiting.  Your symptoms are not better in 3 days. Return sooner if you are getting worse. Get help right away if:  You develop severe vomiting and are unable to keep the medicine down.  You develop severe back or abdominal pain despite taking your medicines.  You begin passing a large amount of blood or clots in your urine.  You feel extremely weak or faint, or you pass out. This information is not intended to replace advice given to you by your health care provider. Make sure you discuss any questions you have with your health care provider. Document Released: 10/13/2005 Document Revised: 03/20/2016 Document Reviewed: 06/13/2013 Elsevier Interactive Patient Education  2017 [employer]

## 2017-11-24 NOTE — MAU Note (Signed)
Pt presents with c/o right lower abdominal pain since Sunday.  Reports pain as sharp, worse with movement.  Denies VB.  Reports last LMP 09/13/2017.

## 2017-11-25 LAB — GC/CHLAMYDIA PROBE AMP (~~LOC~~) NOT AT ARMC
CHLAMYDIA, DNA PROBE: NEGATIVE
Neisseria Gonorrhea: NEGATIVE

## 2017-11-25 LAB — HIV ANTIBODY (ROUTINE TESTING W REFLEX): HIV Screen 4th Generation wRfx: NONREACTIVE

## 2018-02-13 IMAGING — US US MFM OB TRANSVAGINAL
1 series · 14 of 28 positions shown · non-contrast
Comparison: none

[Series 1: us mfm ob transvaginal · 58 acquisitions, 14 frames shown]
[im 3/58]
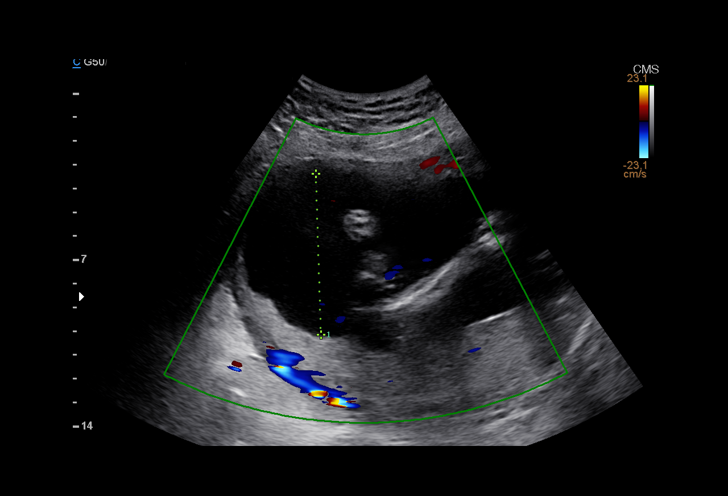
[im 7/58]
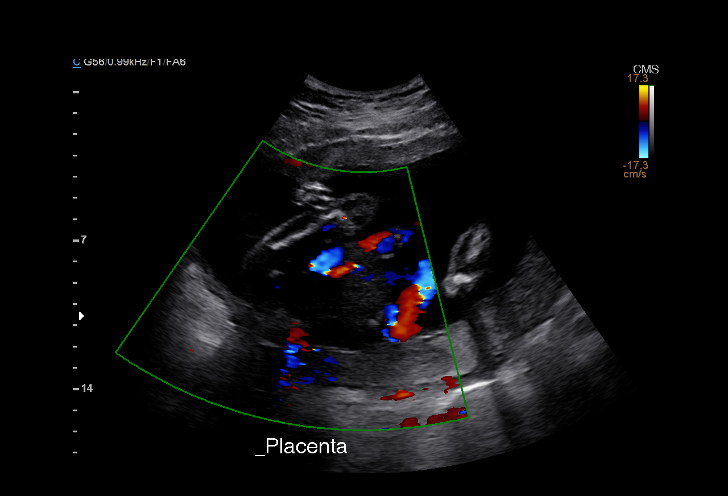
[im 11/58]
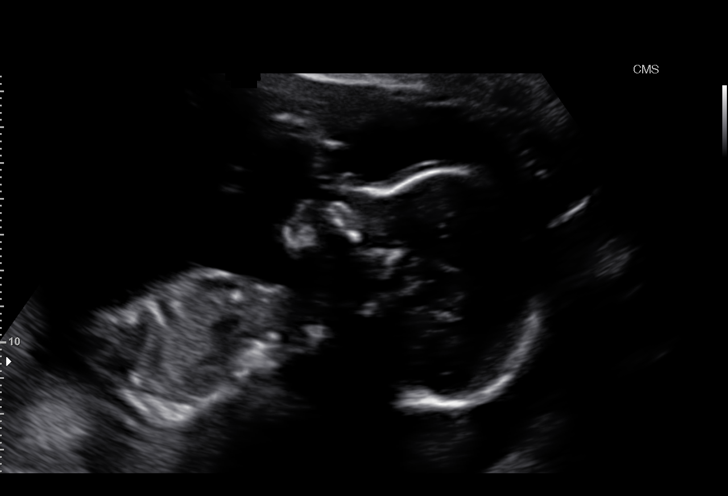
[im 15/58]
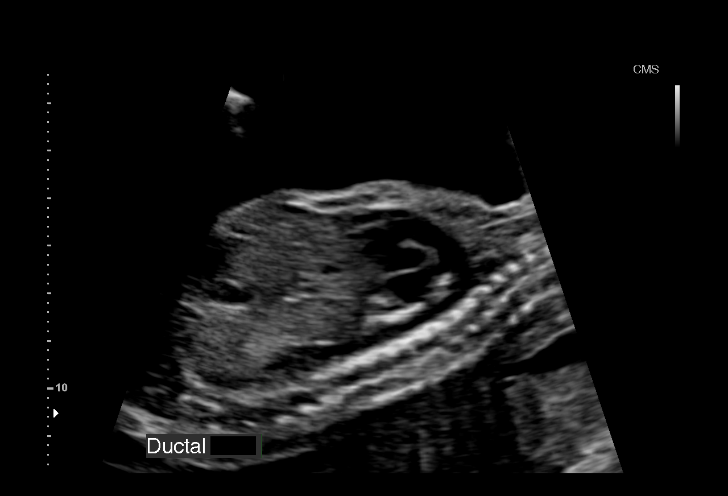
[im 20/58]
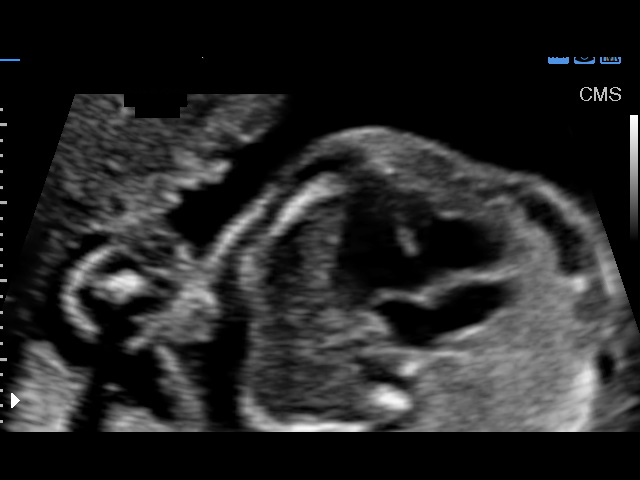
[im 24/58]
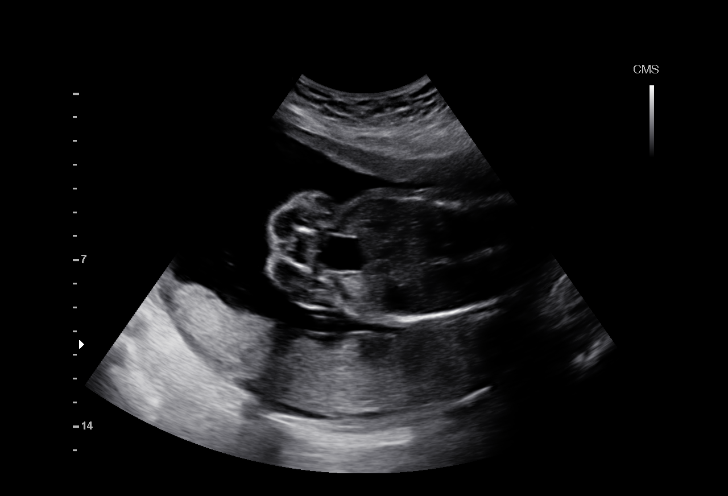
[im 28/58]
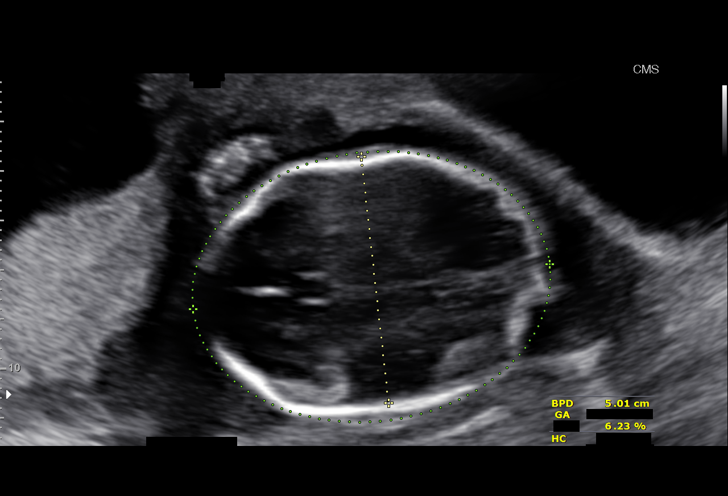
[im 32/58]
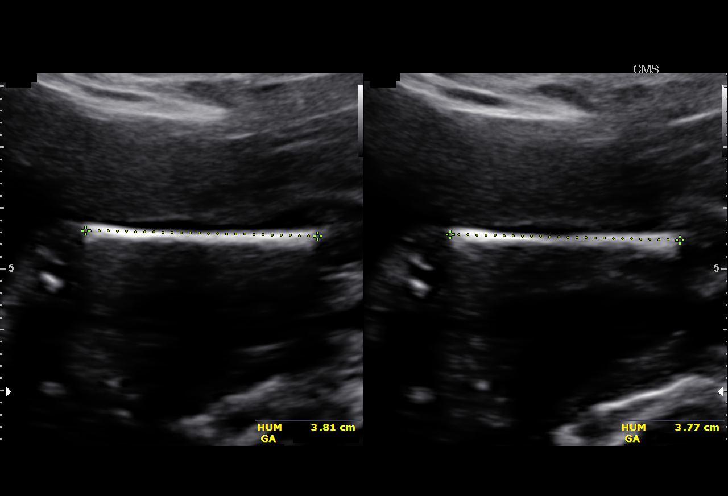
[im 36/58]
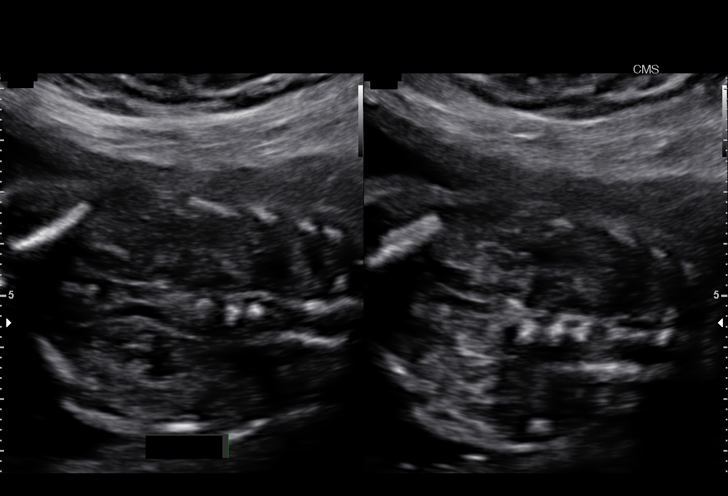
[im 41/58]
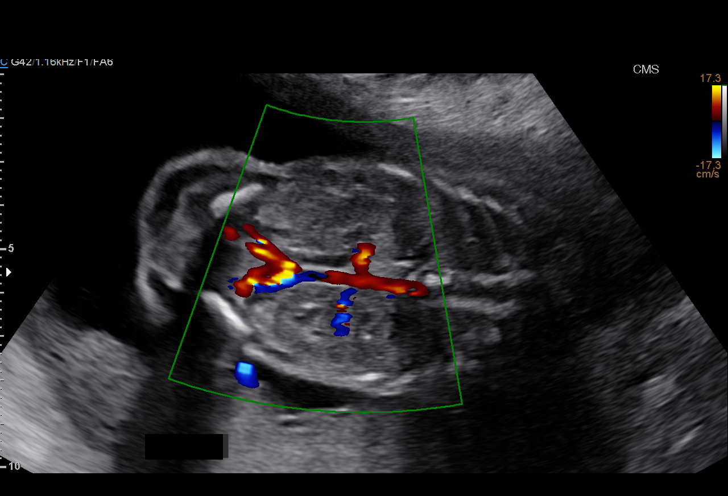
[im 45/58]
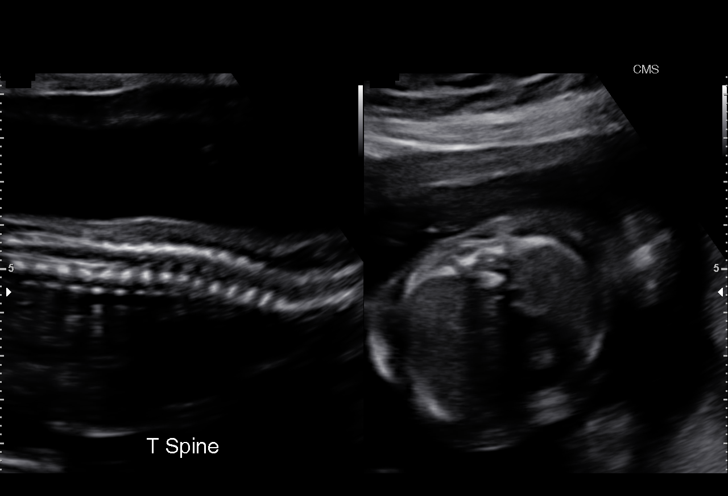
[im 49/58]
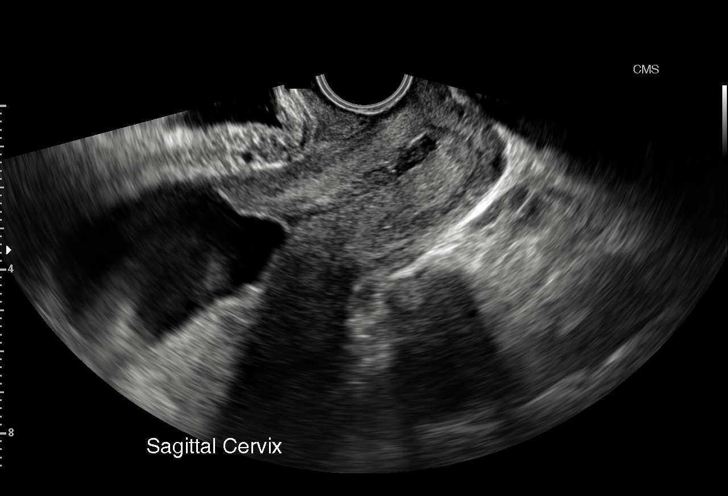
[im 53/58]
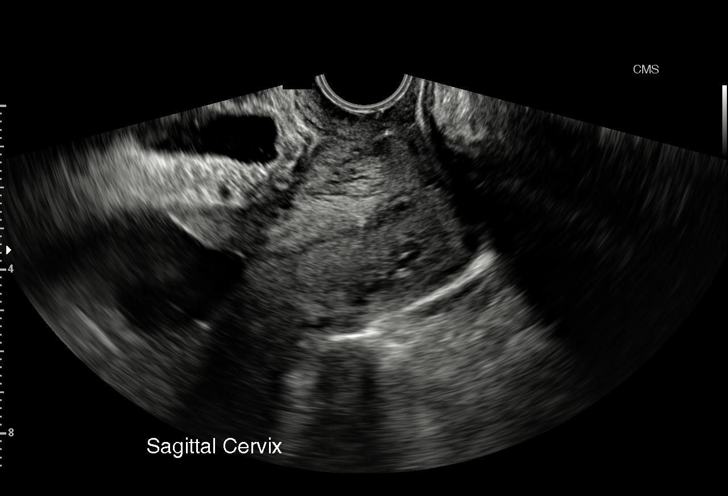
[im 58/58]
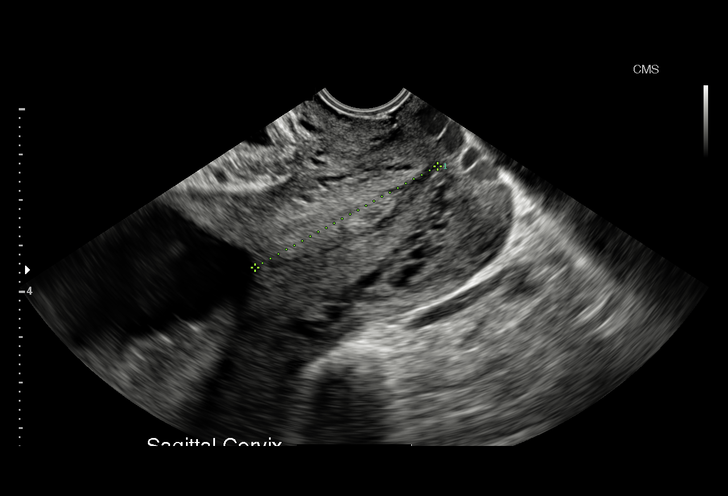

[14 of 28 positions shown; findings below may reference images not displayed]

Obstetrics &
Gynecology
[address]
[place].

1  FEDERICA AFANADOR            [id number]      [id number]     [id number]
2  FEDERICA AFANADOR            [id number]      [id number]     [id number]
Indications

Previous cesarean delivery, antepartum x 4
Family history of congenital anomaly (prev
child with abd wall defect due to amniotic
band)
Poor obstetric history: Previous preterm
delivery, antepartum (27 wk twins; 36 week)
22 weeks gestation of pregnancy
Follow-up incomplete fetal anatomic            Z36
evaluation
OB History

Gravidity:    7         Term:   2        Prem:   3        SAB:   1
TOP:          0       Ectopic:  0        Living: 5
Fetal Evaluation

Num Of Fetuses:     1
Fetal Heart         151
Rate(bpm):
Cardiac Activity:   Observed
Presentation:       Cephalic
Placenta:           Posterior, low-lying,1.0 cm from int os
P. Cord Insertion:  Visualized, central
Amniotic Fluid
AFI FV:      Subjectively within normal limits
Larg Pckt:    6.77  cm
Biometry

BPD:      50.4  mm     G. Age:  21w 2d                  CI:        64.71   %    70 - 86
FL/HC:      19.8   %    19.2 -
HC:      201.4  mm     G. Age:  22w 2d         26  %    HC/AC:      1.12        1.05 -
AC:      180.4  mm     G. Age:  22w 6d         52  %    FL/BPD:     79.0   %    71 - 87
FL:       39.8  mm     G. Age:  22w 6d         48  %    FL/AC:      22.1   %    20 - 24
HUM:      37.9  mm     G. Age:  23w 2d         63  %

Est. FW:     530  gm      1 lb 3 oz     53  %
Gestational Age

U/S Today:     22w 2d                                        EDD:   05/06/16
Best:          22w 4d     Det. By:  Early Ultrasound         EDD:   05/04/16
(10/10/15)
Anatomy

Cranium:          Appears normal         LVOT:             Appears normal
Fetal Cavum:      Appears normal         Aortic Arch:      Appears normal
Ventricles:       Appears normal         Ductal Arch:      Appears normal
Choroid Plexus:   Appears normal         Diaphragm:        Previously seen
Cerebellum:       Appears normal         Stomach:          Appears normal, left
sided
Posterior Fossa:  Appears normal         Abdomen:          Appears normal
Nuchal Fold:      Previously seen        Abdominal Wall:   Appears nml (cord
insert, abd wall)
Face:             Appears normal         Cord Vessels:     Appears normal (3
(orbits and profile)                     vessel cord)
Lips:             Appears normal         Kidneys:          Appear normal
Palate:           Previously seen        Bladder:          Appears normal
Fetal Thoracic:   Appears normal         Spine:            Appears normal
Heart:            Appears normal         Upper             Previously seen
(4CH, axis, and        Extremities:
situs)
RVOT:             Appears normal         Lower             Previously seen
Extremities:

Other:  Fetus appears to be a female. Heels previously visualized.
Cervix Uterus Adnexa

Cervix
Length:            4.8  cm.
Normal appearance by transvaginal scan

Uterus
No abnormality visualized.

Left Ovary
Within normal limits.

Right Ovary
Within normal limits.

Cul De Sac:   No free fluid seen.
Adnexa:       No abnormality visualized.
Impression

SIUP at 66w1d
EFW 53rd%'le
no dysmorphic features
posterior placenta, marginal previa (1cm from the os)
cervix is long and closed
Recommendations

Given hx of early preterm delivery,
-cervical length q2 weeks until 28 weeks
-Interval growth and placental location in 4 weeks.

Pelvic rest is recommended until marginal previa has
resolved.

## 2018-05-22 ENCOUNTER — Inpatient Hospital Stay (HOSPITAL_COMMUNITY)
Admission: AD | Admit: 2018-05-22 | Discharge: 2018-05-23 | Disposition: A | Discharge status: Home / Self Care | Payer: Self-pay | Source: Ambulatory Visit | Attending: Obstetrics and Gynecology | Admitting: Obstetrics and Gynecology

## 2018-05-22 ENCOUNTER — Other Ambulatory Visit: Payer: Self-pay

## 2018-05-22 DIAGNOSIS — N939 Abnormal uterine and vaginal bleeding, unspecified: Secondary | ICD-10-CM

## 2018-05-22 DIAGNOSIS — Z882 Allergy status to sulfonamides status: Secondary | ICD-10-CM | POA: Insufficient documentation

## 2018-05-22 DIAGNOSIS — N926 Irregular menstruation, unspecified: Secondary | ICD-10-CM | POA: Insufficient documentation

## 2018-05-22 DIAGNOSIS — Z87891 Personal history of nicotine dependence: Secondary | ICD-10-CM | POA: Insufficient documentation

## 2018-05-22 DIAGNOSIS — Z9851 Tubal ligation status: Secondary | ICD-10-CM | POA: Insufficient documentation

## 2018-05-22 DIAGNOSIS — Z881 Allergy status to other antibiotic agents status: Secondary | ICD-10-CM | POA: Insufficient documentation

## 2018-05-22 DIAGNOSIS — Z880 Allergy status to penicillin: Secondary | ICD-10-CM | POA: Insufficient documentation

## 2018-05-22 DIAGNOSIS — R1030 Lower abdominal pain, unspecified: Principal | ICD-10-CM | POA: Insufficient documentation

## 2018-05-22 NOTE — MAU Note (Addendum)
Pt reports + UPT 6-7 weeks ago. Has a hx of irregular periods and unsure of LMP. Maybe May or June, but bleeding was just for one day. Pt has had pain since Friday and spotting today. Pt had a +UPT last year and neg blood test. Pt has had a tubal ligation in 2017

## 2018-05-23 ENCOUNTER — Encounter (HOSPITAL_COMMUNITY): Payer: Self-pay | Admitting: *Deleted

## 2018-05-23 LAB — CBC WITH DIFFERENTIAL/PLATELET
BASOS ABS: 0 10*3/uL (ref 0.0–0.1)
BASOS PCT: 0 %
Eosinophils Absolute: 0.1 10*3/uL (ref 0.0–0.7)
Eosinophils Relative: 1 %
HEMATOCRIT: 35 % — AB (ref 36.0–46.0)
Hemoglobin: 11.5 g/dL — ABNORMAL LOW (ref 12.0–15.0)
LYMPHS PCT: 29 %
Lymphs Abs: 2.8 10*3/uL (ref 0.7–4.0)
MCH: 27.9 pg (ref 26.0–34.0)
MCHC: 32.9 g/dL (ref 30.0–36.0)
MCV: 85 fL (ref 78.0–100.0)
MONO ABS: 0.9 10*3/uL (ref 0.1–1.0)
Monocytes Relative: 9 %
NEUTROS ABS: 5.8 10*3/uL (ref 1.7–7.7)
Neutrophils Relative %: 61 %
PLATELETS: 265 10*3/uL (ref 150–400)
RBC: 4.12 MIL/uL (ref 3.87–5.11)
RDW: 13.3 % (ref 11.5–15.5)
WBC: 9.5 10*3/uL (ref 4.0–10.5)

## 2018-05-23 LAB — URINALYSIS, ROUTINE W REFLEX MICROSCOPIC
BILIRUBIN URINE: NEGATIVE
Glucose, UA: NEGATIVE mg/dL
Hgb urine dipstick: NEGATIVE
Ketones, ur: NEGATIVE mg/dL
LEUKOCYTES UA: NEGATIVE
NITRITE: NEGATIVE
PH: 5 (ref 5.0–8.0)
Protein, ur: NEGATIVE mg/dL
SPECIFIC GRAVITY, URINE: 1.03 (ref 1.005–1.030)

## 2018-05-23 LAB — POCT PREGNANCY, URINE: PREG TEST UR: NEGATIVE

## 2018-05-23 LAB — HCG, QUANTITATIVE, PREGNANCY

## 2018-05-23 MED ORDER — KETOROLAC TROMETHAMINE 60 MG/2ML IM SOLN
30.0000 mg | Freq: Once | INTRAMUSCULAR | Status: AC
  Administered 2018-05-23: 30 mg via INTRAMUSCULAR
  Filled 2018-05-23: qty 2

## 2018-05-23 MED ORDER — ACETAMINOPHEN 325 MG PO TABS
650.0000 mg | ORAL_TABLET | Freq: Once | ORAL | Status: AC
  Administered 2018-05-23: 650 mg via ORAL
  Filled 2018-05-23: qty 2
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
## 2018-05-23 NOTE — Discharge Instructions (Signed)

## 2018-09-02 ENCOUNTER — Emergency Department (HOSPITAL_COMMUNITY)
Admission: EM | Admit: 2018-09-02 | Discharge: 2018-09-02 | Disposition: A | Discharge status: Home / Self Care | Payer: Self-pay | Attending: Emergency Medicine | Admitting: Emergency Medicine

## 2018-09-02 ENCOUNTER — Other Ambulatory Visit: Payer: Self-pay

## 2018-09-02 ENCOUNTER — Encounter (HOSPITAL_COMMUNITY): Payer: Self-pay | Admitting: Emergency Medicine

## 2018-09-02 DIAGNOSIS — Z91040 Latex allergy status: Secondary | ICD-10-CM | POA: Insufficient documentation

## 2018-09-02 DIAGNOSIS — R202 Paresthesia of skin: Secondary | ICD-10-CM | POA: Insufficient documentation

## 2018-09-02 DIAGNOSIS — R209 Unspecified disturbances of skin sensation: Secondary | ICD-10-CM

## 2018-09-02 DIAGNOSIS — Z87891 Personal history of nicotine dependence: Secondary | ICD-10-CM | POA: Insufficient documentation

## 2018-09-02 NOTE — Discharge Instructions (Signed)
Alternate 400 mg of ibuprofen and [phone] mg of Tylenol every 3-4 hours as needed for pain or swelling. Do not exceed 4000 mg of Tylenol daily.  You can apply ice for comfort up to 3 times a day for 20 minutes at a time.  Follow-up with your PCP or ENT for reevaluation of symptoms if they persist.  Return to emergency department if any concerning signs or symptoms develop such as facial droop, drooling, difficulty breathing or swallowing, or severe headaches
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
## 2018-09-02 NOTE — ED Notes (Signed)
Declined W/C at D/C and was escorted to lobby by RN. 

## 2018-12-21 ENCOUNTER — Emergency Department (HOSPITAL_BASED_OUTPATIENT_CLINIC_OR_DEPARTMENT_OTHER): Payer: Self-pay

## 2018-12-21 ENCOUNTER — Encounter (HOSPITAL_BASED_OUTPATIENT_CLINIC_OR_DEPARTMENT_OTHER): Payer: Self-pay | Admitting: *Deleted

## 2018-12-21 ENCOUNTER — Emergency Department (HOSPITAL_BASED_OUTPATIENT_CLINIC_OR_DEPARTMENT_OTHER)
Admission: EM | Admit: 2018-12-21 | Discharge: 2018-12-21 | Disposition: A | Discharge status: Home / Self Care | Payer: Self-pay | Attending: Emergency Medicine | Admitting: Emergency Medicine

## 2018-12-21 ENCOUNTER — Other Ambulatory Visit: Payer: Self-pay

## 2018-12-21 DIAGNOSIS — M79671 Pain in right foot: Secondary | ICD-10-CM | POA: Insufficient documentation

## 2018-12-21 DIAGNOSIS — W208XXA Other cause of strike by thrown, projected or falling object, initial encounter: Secondary | ICD-10-CM | POA: Insufficient documentation

## 2018-12-21 DIAGNOSIS — Z91040 Latex allergy status: Secondary | ICD-10-CM | POA: Insufficient documentation

## 2018-12-21 DIAGNOSIS — Z87891 Personal history of nicotine dependence: Secondary | ICD-10-CM | POA: Insufficient documentation
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
## 2018-12-21 NOTE — ED Triage Notes (Signed)
Pt states a cabinet fell on her right foot tonight. +swelling noted

## 2018-12-21 NOTE — ED Notes (Signed)
Patient transported to X-ray 

## 2018-12-21 NOTE — Discharge Instructions (Signed)

## 2021-04-10 DIAGNOSIS — N644 Mastodynia: Secondary | ICD-10-CM | POA: Diagnosis not present

## 2021-07-27 DIAGNOSIS — Z419 Encounter for procedure for purposes other than remedying health state, unspecified: Secondary | ICD-10-CM | POA: Diagnosis not present

## 2021-08-27 DIAGNOSIS — Z419 Encounter for procedure for purposes other than remedying health state, unspecified: Secondary | ICD-10-CM | POA: Diagnosis not present

## 2021-09-26 DIAGNOSIS — Z419 Encounter for procedure for purposes other than remedying health state, unspecified: Secondary | ICD-10-CM | POA: Diagnosis not present

## 2021-10-27 DIAGNOSIS — Z419 Encounter for procedure for purposes other than remedying health state, unspecified: Secondary | ICD-10-CM | POA: Diagnosis not present

## 2021-11-27 DIAGNOSIS — Z419 Encounter for procedure for purposes other than remedying health state, unspecified: Secondary | ICD-10-CM | POA: Diagnosis not present

## 2021-12-25 DIAGNOSIS — Z419 Encounter for procedure for purposes other than remedying health state, unspecified: Secondary | ICD-10-CM | POA: Diagnosis not present

## 2022-01-25 DIAGNOSIS — Z419 Encounter for procedure for purposes other than remedying health state, unspecified: Secondary | ICD-10-CM | POA: Diagnosis not present

## 2022-02-24 DIAGNOSIS — Z419 Encounter for procedure for purposes other than remedying health state, unspecified: Secondary | ICD-10-CM | POA: Diagnosis not present

## 2022-03-10 ENCOUNTER — Encounter (HOSPITAL_BASED_OUTPATIENT_CLINIC_OR_DEPARTMENT_OTHER): Payer: Self-pay

## 2022-03-10 ENCOUNTER — Other Ambulatory Visit: Payer: Self-pay

## 2022-03-10 ENCOUNTER — Emergency Department (HOSPITAL_BASED_OUTPATIENT_CLINIC_OR_DEPARTMENT_OTHER): Payer: Medicaid Other | Admitting: Radiology

## 2022-03-10 DIAGNOSIS — R0789 Other chest pain: Secondary | ICD-10-CM | POA: Diagnosis not present

## 2022-03-10 DIAGNOSIS — Z91040 Latex allergy status: Secondary | ICD-10-CM | POA: Diagnosis not present

## 2022-03-10 DIAGNOSIS — J22 Unspecified acute lower respiratory infection: Secondary | ICD-10-CM | POA: Diagnosis not present

## 2022-03-10 DIAGNOSIS — R059 Cough, unspecified: Secondary | ICD-10-CM | POA: Diagnosis present

## 2022-03-10 LAB — BASIC METABOLIC PANEL
Anion gap: 8 (ref 5–15)
BUN: 9 mg/dL (ref 6–20)
CO2: 25 mmol/L (ref 22–32)
Calcium: 8.8 mg/dL — ABNORMAL LOW (ref 8.9–10.3)
Chloride: 105 mmol/L (ref 98–111)
Creatinine, Ser: 0.7 mg/dL (ref 0.44–1.00)
GFR, Estimated: >60 mL/min (ref >60)
Glucose, Bld: 84 mg/dL (ref 70–99)
Potassium: 3.6 mmol/L (ref 3.5–5.1)
Sodium: 138 mmol/L (ref 135–145)

## 2022-03-10 LAB — CBC
HCT: 35.4 % — ABNORMAL LOW (ref 36.0–46.0)
Hemoglobin: 11.1 g/dL — ABNORMAL LOW (ref 12.0–15.0)
MCH: 27.2 pg (ref 26.0–34.0)
MCHC: 31.4 g/dL (ref 30.0–36.0)
MCV: 86.8 fL (ref 80.0–100.0)
Platelets: 267 10*3/uL (ref 150–400)
RBC: 4.08 MIL/uL (ref 3.87–5.11)
RDW: 13.1 % (ref 11.5–15.5)
WBC: 6.8 10*3/uL (ref 4.0–10.5)
nRBC: 0 % (ref 0.0–0.2)

## 2022-03-10 LAB — HCG, SERUM, QUALITATIVE: Preg, Serum: NEGATIVE

## 2022-03-10 LAB — TROPONIN I (HIGH SENSITIVITY): Troponin I (High Sensitivity): <2 ng/L (ref <18)

## 2022-03-10 NOTE — ED Triage Notes (Signed)
Patient here POV from Home with Cough. ? ?Endorses Cough for approximately 1-2 Weeks that is Productive in [place] more Recently. States she "feels it in her Chest" when she coughs.  ? ?No Known Fevers.  ? ?EKG WNL in Triage. A&Ox4. GCS 15. Ambulatory. NAD Noted.  ?

## 2022-03-11 ENCOUNTER — Emergency Department (HOSPITAL_BASED_OUTPATIENT_CLINIC_OR_DEPARTMENT_OTHER)
Admission: EM | Admit: 2022-03-11 | Discharge: 2022-03-11 | Disposition: A | Discharge status: Home / Self Care | Payer: Medicaid Other | Attending: Emergency Medicine | Admitting: Emergency Medicine

## 2022-03-11 DIAGNOSIS — J22 Unspecified acute lower respiratory infection: Secondary | ICD-10-CM

## 2022-03-11 MED ORDER — DOXYCYCLINE HYCLATE 100 MG PO CAPS: 100.0000 mg | ORAL_CAPSULE | Freq: Two times a day (BID) | ORAL | 0 refills | Status: DC

## 2022-03-11 MED ORDER — DOXYCYCLINE HYCLATE 100 MG PO TABS
100.0000 mg | ORAL_TABLET | Freq: Once | ORAL | Status: AC
  Administered 2022-03-11: 100 mg via ORAL
  Filled 2022-03-11: qty 1

## 2022-03-11 MED ORDER — FLUCONAZOLE 150 MG PO TABS: 150.0000 mg | ORAL_TABLET | Freq: Once | ORAL | 0 refills | Status: AC

## 2022-03-11 NOTE — ED Provider Notes (Signed)
? ?[facility] EMERGENCY DEPT  ?Provider Note ? ?CSN: [id number] ?Arrival date & time: 03/10/22 2227 ? ?History ?Chief Complaint  ?Patient presents with  ? Cough  ? ? ?[name] is a 36 y.o. female reports 2-3 weeks of dry cough that has now progressed to green sputum, occasionally blood tinged. No fevers. Some chest soreness and sore throat.  ? ? ?Home Medications ?Prior to Admission medications   ?Medication Sig Start Date End Date Taking? Authorizing Provider  ?doxycycline (VIBRAMYCIN) 100 MG capsule Take 1 capsule (100 mg total) by mouth 2 (two) times daily. 03/11/22  Yes Pollyann Savoy, MD  ?fluconazole (DIFLUCAN) 150 MG tablet Take 1 tablet (150 mg total) by mouth once for 1 dose. 03/11/22 03/11/22 Yes Pollyann Savoy, MD  ? ? ? ?Allergies    ?Amoxicillin, Ciprofloxacin, Penicillins, Sulfa antibiotics, Fish-derived products, and Latex ? ? ?Review of Systems   ?Review of Systems ?Please see HPI for pertinent positives and negatives ? ?Physical Exam ?BP 110/76 (BP Location: Right Arm)   Pulse 72   Temp 98 ?F (36.7 ?C) (Oral)   Resp 16   Ht 5\' 3"  (1.6 m)   Wt 97.1 kg   LMP 02/26/2022 (Approximate)   SpO2 100%   BMI 37.92 kg/m?  ? ?Physical Exam ?Vitals and nursing note reviewed.  ?Constitutional:   ?   Appearance: Normal appearance.  ?HENT:  ?   Head: Normocephalic and atraumatic.  ?   Nose: Nose normal.  ?   Mouth/Throat:  ?   Mouth: Mucous membranes are moist.  ?Eyes:  ?   Extraocular Movements: Extraocular movements intact.  ?   Conjunctiva/sclera: Conjunctivae normal.  ?Cardiovascular:  ?   Rate and Rhythm: Normal rate.  ?Pulmonary:  ?   Effort: Pulmonary effort is normal.  ?   Breath sounds: Normal breath sounds.  ?Abdominal:  ?   General: Abdomen is flat.  ?   Palpations: Abdomen is soft.  ?   Tenderness: There is no abdominal tenderness.  ?Musculoskeletal:     ?   General: No swelling. Normal range of motion.  ?   Cervical back: Neck supple.  ?Skin: ?   General: Skin is warm and  dry.  ?Neurological:  ?   General: No focal deficit present.  ?   Mental Status: She is alert.  ?Psychiatric:     ?   Mood and Affect: Mood normal.  ? ? ?ED Results / Procedures / Treatments   ?EKG ?NSR ?Rate 67 ?Normal ST/T waves ?Normal intervals ?No old to compare ? ?Procedures ?Procedures ? ?Medications Ordered in the ED ?Medications  ?doxycycline (VIBRA-TABS) tablet 100 mg (has no administration in time range)  ? ? ?Initial Impression and Plan ? Patient with symptoms of cough, initially dry but has progressed to green sputum more recently. Labs and imaging done in triage showed CBC with mild anemia at baseline. BMP and Trop are normal. I personally viewed the images from radiology studies and agree with radiologist interpretation: CXR is clear. Suspect she has had a viral bronchitis but given progression of sputum and worsening cough instead of improving, will treat with a course of Doxycycline. Otherwise recommend OTC symptomatic care.  ? ? ?ED Course  ? ?  ? ? ?MDM Rules/Calculators/A&P ?Medical Decision Making ?Problems Addressed: ?Lower respiratory tract infection: acute illness or injury ? ?Amount and/or Complexity of Data Reviewed ?Labs: ordered. Decision-making details documented in ED Course. ?Radiology: ordered and independent interpretation performed. Decision-making details documented in  ED Course. ?ECG/medicine tests: ordered and independent interpretation performed. Decision-making details documented in ED Course. ? ?Risk ?OTC drugs. ?Prescription drug management. ? ? ? ?Final Clinical Impression(s) / ED Diagnoses ?Final diagnoses:  ?Lower respiratory tract infection  ? ? ?Rx / DC Orders ?ED Discharge Orders   ? ?      Ordered  ?  doxycycline (VIBRAMYCIN) 100 MG capsule  2 times daily       ? 03/11/22 0120  ?  fluconazole (DIFLUCAN) 150 MG tablet   Once       ? 03/11/22 0120  ? ?  ?  ? ?  ? ?  ?Pollyann Savoy, MD ?03/11/22 0120 ? ?

## 2022-03-11 NOTE — ED Notes (Signed)
Pt verbalizes understanding of discharge instructions. Opportunity for questioning and answers were provided. Pt discharged from ED to home.   ? ?

## 2022-03-27 DIAGNOSIS — Z419 Encounter for procedure for purposes other than remedying health state, unspecified: Secondary | ICD-10-CM | POA: Diagnosis not present

## 2022-04-26 DIAGNOSIS — Z419 Encounter for procedure for purposes other than remedying health state, unspecified: Secondary | ICD-10-CM | POA: Diagnosis not present

## 2022-05-27 DIAGNOSIS — Z419 Encounter for procedure for purposes other than remedying health state, unspecified: Secondary | ICD-10-CM | POA: Diagnosis not present

## 2022-06-27 DIAGNOSIS — Z419 Encounter for procedure for purposes other than remedying health state, unspecified: Secondary | ICD-10-CM | POA: Diagnosis not present

## 2022-07-27 DIAGNOSIS — Z419 Encounter for procedure for purposes other than remedying health state, unspecified: Secondary | ICD-10-CM | POA: Diagnosis not present

## 2022-08-06 DIAGNOSIS — Z1231 Encounter for screening mammogram for malignant neoplasm of breast: Secondary | ICD-10-CM | POA: Diagnosis not present

## 2022-08-13 DIAGNOSIS — E010 Iodine-deficiency related diffuse (endemic) goiter: Secondary | ICD-10-CM | POA: Diagnosis not present

## 2022-08-13 DIAGNOSIS — Z0000 Encounter for general adult medical examination without abnormal findings: Secondary | ICD-10-CM | POA: Diagnosis not present

## 2022-08-13 DIAGNOSIS — E0590 Thyrotoxicosis, unspecified without thyrotoxic crisis or storm: Secondary | ICD-10-CM | POA: Diagnosis not present

## 2022-08-27 DIAGNOSIS — Z419 Encounter for procedure for purposes other than remedying health state, unspecified: Secondary | ICD-10-CM | POA: Diagnosis not present

## 2022-09-26 DIAGNOSIS — Z419 Encounter for procedure for purposes other than remedying health state, unspecified: Secondary | ICD-10-CM | POA: Diagnosis not present

## 2022-10-05 ENCOUNTER — Emergency Department (HOSPITAL_BASED_OUTPATIENT_CLINIC_OR_DEPARTMENT_OTHER)
Admission: EM | Admit: 2022-10-05 | Discharge: 2022-10-05 | Discharge status: Against Medical Advice | Payer: Medicaid Other | Source: Home / Self Care

## 2022-10-27 DIAGNOSIS — Z419 Encounter for procedure for purposes other than remedying health state, unspecified: Secondary | ICD-10-CM | POA: Diagnosis not present

## 2022-11-27 DIAGNOSIS — Z419 Encounter for procedure for purposes other than remedying health state, unspecified: Secondary | ICD-10-CM | POA: Diagnosis not present

## 2022-12-26 DIAGNOSIS — Z419 Encounter for procedure for purposes other than remedying health state, unspecified: Secondary | ICD-10-CM | POA: Diagnosis not present

## 2023-01-26 DIAGNOSIS — Z419 Encounter for procedure for purposes other than remedying health state, unspecified: Secondary | ICD-10-CM | POA: Diagnosis not present

## 2023-02-25 DIAGNOSIS — Z419 Encounter for procedure for purposes other than remedying health state, unspecified: Secondary | ICD-10-CM | POA: Diagnosis not present

## 2023-03-28 DIAGNOSIS — Z419 Encounter for procedure for purposes other than remedying health state, unspecified: Secondary | ICD-10-CM | POA: Diagnosis not present

## 2023-04-27 DIAGNOSIS — Z419 Encounter for procedure for purposes other than remedying health state, unspecified: Secondary | ICD-10-CM | POA: Diagnosis not present

## 2023-05-28 DIAGNOSIS — Z419 Encounter for procedure for purposes other than remedying health state, unspecified: Secondary | ICD-10-CM | POA: Diagnosis not present

## 2023-06-28 DIAGNOSIS — Z419 Encounter for procedure for purposes other than remedying health state, unspecified: Secondary | ICD-10-CM | POA: Diagnosis not present

## 2023-07-28 DIAGNOSIS — Z419 Encounter for procedure for purposes other than remedying health state, unspecified: Secondary | ICD-10-CM | POA: Diagnosis not present

## 2023-08-28 DIAGNOSIS — Z419 Encounter for procedure for purposes other than remedying health state, unspecified: Secondary | ICD-10-CM | POA: Diagnosis not present

## 2023-09-27 DIAGNOSIS — Z419 Encounter for procedure for purposes other than remedying health state, unspecified: Secondary | ICD-10-CM | POA: Diagnosis not present

## 2023-10-28 DIAGNOSIS — Z419 Encounter for procedure for purposes other than remedying health state, unspecified: Secondary | ICD-10-CM | POA: Diagnosis not present

## 2023-11-26 DIAGNOSIS — F918 Other conduct disorders: Secondary | ICD-10-CM | POA: Diagnosis not present

## 2023-11-26 DIAGNOSIS — F4389 Other reactions to severe stress: Secondary | ICD-10-CM | POA: Diagnosis not present

## 2023-11-28 DIAGNOSIS — Z419 Encounter for procedure for purposes other than remedying health state, unspecified: Secondary | ICD-10-CM | POA: Diagnosis not present

## 2023-11-29 ENCOUNTER — Other Ambulatory Visit: Payer: Self-pay

## 2023-11-29 ENCOUNTER — Emergency Department (HOSPITAL_BASED_OUTPATIENT_CLINIC_OR_DEPARTMENT_OTHER)
Admission: EM | Admit: 2023-11-29 | Discharge: 2023-11-29 | Disposition: A | Discharge status: Home / Self Care | Payer: Self-pay | Attending: Emergency Medicine | Admitting: Emergency Medicine

## 2023-11-29 ENCOUNTER — Encounter (HOSPITAL_BASED_OUTPATIENT_CLINIC_OR_DEPARTMENT_OTHER): Payer: Self-pay | Admitting: Emergency Medicine

## 2023-11-29 DIAGNOSIS — Z23 Encounter for immunization: Secondary | ICD-10-CM | POA: Insufficient documentation

## 2023-11-29 DIAGNOSIS — S61213A Laceration without foreign body of left middle finger without damage to nail, initial encounter: Secondary | ICD-10-CM | POA: Insufficient documentation

## 2023-11-29 DIAGNOSIS — W268XXA Contact with other sharp object(s), not elsewhere classified, initial encounter: Secondary | ICD-10-CM | POA: Insufficient documentation

## 2023-11-29 DIAGNOSIS — Z91040 Latex allergy status: Secondary | ICD-10-CM | POA: Diagnosis not present

## 2023-11-29 DIAGNOSIS — S61211A Laceration without foreign body of left index finger without damage to nail, initial encounter: Secondary | ICD-10-CM | POA: Diagnosis not present

## 2023-11-29 MED ORDER — FLUCONAZOLE 200 MG PO TABS: 200.0000 mg | ORAL_TABLET | Freq: Once | ORAL | 0 refills | Status: AC

## 2023-11-29 MED ORDER — LIDOCAINE-EPINEPHRINE-TETRACAINE (LET) TOPICAL GEL
3.0000 mL | Freq: Once | TOPICAL | Status: AC
  Administered 2023-11-29: 3 mL via TOPICAL
  Filled 2023-11-29: qty 3

## 2023-11-29 MED ORDER — LIDOCAINE HCL (PF) 1 % IJ SOLN
5.0000 mL | Freq: Once | INTRAMUSCULAR | Status: AC
  Administered 2023-11-29: 5 mL
  Filled 2023-11-29: qty 5

## 2023-11-29 MED ORDER — TETANUS-DIPHTH-ACELL PERTUSSIS 5-2.5-18.5 LF-MCG/0.5 IM SUSY
0.5000 mL | PREFILLED_SYRINGE | Freq: Once | INTRAMUSCULAR | Status: AC
  Administered 2023-11-29: 0.5 mL via INTRAMUSCULAR
  Filled 2023-11-29: qty 0.5

## 2023-11-29 MED ORDER — DOXYCYCLINE HYCLATE 100 MG PO TABS
100.0000 mg | ORAL_TABLET | Freq: Once | ORAL | Status: AC
  Administered 2023-11-29: 100 mg via ORAL
  Filled 2023-11-29: qty 1

## 2023-11-29 MED ORDER — DOXYCYCLINE HYCLATE 100 MG PO CAPS: 100.0000 mg | ORAL_CAPSULE | Freq: Two times a day (BID) | ORAL | 0 refills | Status: AC

## 2023-11-29 NOTE — ED Triage Notes (Signed)
Pt was wearing a glove at the time. Glove removed deep laceration to left index and middle finger.

## 2023-11-29 NOTE — Discharge Instructions (Addendum)
Those wounds need to be evaluated in 5 to 7 days.  You can take doxycycline once in the morning once in the evening for the next 3 days.  Leave the dressing on today, tomorrow when you can take the dressing down and gently wash area with soap and water.  To have the wounds examined you can go to an urgent care, return to the emergency room or follow-up with your PCP.

## 2023-11-29 NOTE — ED Provider Notes (Signed)
[place] EMERGENCY DEPARTMENT AT [facility] Provider Note   CSN: [id number] Arrival date & time: 11/29/23  1617     History  No chief complaint on file.   [name] is a 38 y.o. female.  38 year old female here today for a laceration to the left second and third finger.  Patient was wearing gloves, using a hedge tremor.  Accidentally grabbed the blade of the hedge tremor and lacerated her second third finger.  Patient right-hand-dominant.        Home Medications Prior to Admission medications   Medication Sig Start Date End Date Taking? Authorizing Provider  doxycycline (VIBRAMYCIN) 100 MG capsule Take 1 capsule (100 mg total) by mouth 2 (two) times daily for 3 days. 11/29/23 12/02/23 Yes Anders Simmonds T, DO      Allergies    Amoxicillin, Ciprofloxacin, Penicillins, Sulfa antibiotics, Fish-derived products, and Latex    Review of Systems   Review of Systems  Physical Exam Updated Vital Signs BP 126/80 (BP Location: Right Arm)   Pulse 88   Temp 98.1 F (36.7 C) (Oral)   Resp 14   SpO2 99%  Physical Exam Vitals and nursing note reviewed.  Cardiovascular:     Rate and Rhythm: Normal rate.  Musculoskeletal:     Comments: Intact FDS, FDP function removed affected fingers.  Skin:    Comments: On the palmar aspect of the second finger there is a 2.1 cm laceration between the PIP and DIP.  On the palmar aspect of the third finger there is a 2.1 cm laceration between the DIP and PIP.  There does not appear to be any tendon involvement, subcutaneous tissue visible.  No arterial bleeding or venous bleeding.  Neurological:     Mental Status: She is alert.     Comments: Intact median, radial, ulnar nerve sensation and function.     ED Results / Procedures / Treatments   Labs (all labs ordered are listed, but only abnormal results are displayed) Labs Reviewed - No data to display  EKG None  Radiology No results found.  Procedures .Laceration  Repair  Date/Time: 11/29/2023 6:49 PM  Performed by: Arletha Pili, DO Authorized by: Arletha Pili, DO   Laceration details:    Length (cm):  4.2 Repair type:    Repair type:  Intermediate Comments:     I anesthetized the area using let gel.  Wounds irrigated using 2 L of sterile saline with iodine.  On the second finger, I placed 4 simple interrupted sutures using 4-0 Prolene with good hemostasis.  Some macerated tissue at the radial aspect that was not able to be brought together.  On the third finger, placed 5 simple interrupted sutures using 4-0 Prolene.  There are some areas of macerated tissue which are unable to be brought together will heal via secondary intention.     Medications Ordered in ED Medications  lidocaine-EPINEPHrine-tetracaine (LET) topical gel (has no administration in time range)  lidocaine (PF) (XYLOCAINE) 1 % injection 5 mL (has no administration in time range)  lidocaine-EPINEPHrine-tetracaine (LET) topical gel (3 mLs Topical Given 11/29/23 1742)  Tdap (BOOSTRIX) injection 0.5 mL (0.5 mLs Intramuscular Given 11/29/23 1742)    ED Course/ Medical Decision Making/ A&P                                 Medical Decision Making 38 year old female here today for superficial laceration on the  palmar aspect of the left hand.  Plan-wounds to the second and third finger.  Superficial, no evidence of tendon injury.  Areas repaired.  Tetanus provided.  Given the contamination of the wound, will treat the patient with prophylaxis Keflex.  Neurovascular intact following procedure.  Risk Prescription drug management.           Final Clinical Impression(s) / ED Diagnoses Final diagnoses:  Laceration of left index finger without foreign body without damage to nail, initial encounter  Laceration of left middle finger without foreign body without damage to nail, initial encounter    Rx / DC Orders ED Discharge Orders          Ordered    doxycycline  (VIBRAMYCIN) 100 MG capsule  2 times daily        11/29/23 1853              Anders Simmonds T, DO 11/29/23 1854

## 2023-11-29 NOTE — ED Triage Notes (Signed)
Left index and middle finger cut with small electric saw . Tetanus up to date

## 2023-12-26 DIAGNOSIS — Z419 Encounter for procedure for purposes other than remedying health state, unspecified: Secondary | ICD-10-CM | POA: Diagnosis not present

## 2024-02-06 DIAGNOSIS — Z419 Encounter for procedure for purposes other than remedying health state, unspecified: Secondary | ICD-10-CM | POA: Diagnosis not present

## 2024-03-07 DIAGNOSIS — Z419 Encounter for procedure for purposes other than remedying health state, unspecified: Secondary | ICD-10-CM | POA: Diagnosis not present

## 2024-04-07 DIAGNOSIS — Z419 Encounter for procedure for purposes other than remedying health state, unspecified: Secondary | ICD-10-CM | POA: Diagnosis not present

## 2024-05-07 DIAGNOSIS — Z419 Encounter for procedure for purposes other than remedying health state, unspecified: Secondary | ICD-10-CM | POA: Diagnosis not present

## 2024-06-07 DIAGNOSIS — Z419 Encounter for procedure for purposes other than remedying health state, unspecified: Secondary | ICD-10-CM | POA: Diagnosis not present

## 2024-07-08 DIAGNOSIS — Z419 Encounter for procedure for purposes other than remedying health state, unspecified: Secondary | ICD-10-CM | POA: Diagnosis not present

## 2024-10-07 DIAGNOSIS — Z419 Encounter for procedure for purposes other than remedying health state, unspecified: Secondary | ICD-10-CM | POA: Diagnosis not present

## 2024-11-24 ENCOUNTER — Ambulatory Visit: Admitting: Obstetrics and Gynecology
